# Patient Record
Sex: Male | Born: 1985 | Race: White | Hispanic: Yes | Marital: Married | Smoking: Never smoker
Health system: Southern US, Community
[De-identification: ages and names within clinical notes are randomized; demographics above are authoritative.]

## PROBLEM LIST (undated history)

## (undated) DIAGNOSIS — N200 Calculus of kidney: Secondary | ICD-10-CM

## (undated) HISTORY — PX: APPENDECTOMY: SHX54

---

## 2013-07-01 ENCOUNTER — Ambulatory Visit: Payer: Self-pay

## 2014-07-21 ENCOUNTER — Emergency Department (HOSPITAL_COMMUNITY)
Admission: EM | Admit: 2014-07-21 | Discharge: 2014-07-21 | Disposition: A | Discharge status: Home / Self Care | Payer: Self-pay | Attending: Emergency Medicine | Admitting: Emergency Medicine

## 2014-07-21 ENCOUNTER — Encounter (HOSPITAL_COMMUNITY): Payer: Self-pay | Admitting: Emergency Medicine

## 2014-07-21 ENCOUNTER — Emergency Department (HOSPITAL_COMMUNITY): Payer: Self-pay

## 2014-07-21 DIAGNOSIS — R42 Dizziness and giddiness: Secondary | ICD-10-CM | POA: Insufficient documentation

## 2014-07-21 DIAGNOSIS — R059 Cough, unspecified: Secondary | ICD-10-CM

## 2014-07-21 DIAGNOSIS — R51 Headache: Secondary | ICD-10-CM | POA: Insufficient documentation

## 2014-07-21 DIAGNOSIS — R05 Cough: Secondary | ICD-10-CM | POA: Insufficient documentation

## 2014-07-21 DIAGNOSIS — R112 Nausea with vomiting, unspecified: Secondary | ICD-10-CM | POA: Insufficient documentation

## 2014-07-21 LAB — CBC WITH DIFFERENTIAL/PLATELET
Basophils Absolute: 0.1 10*3/uL (ref 0.0–0.1)
Basophils Relative: 1 % (ref 0–1)
Eosinophils Absolute: 0.2 10*3/uL (ref 0.0–0.7)
Eosinophils Relative: 3 % (ref 0–5)
HCT: 46.4 % (ref 39.0–52.0)
Hemoglobin: 15.8 g/dL (ref 13.0–17.0)
Lymphocytes Relative: 38 % (ref 12–46)
Lymphs Abs: 3 10*3/uL (ref 0.7–4.0)
MCH: 29 pg (ref 26.0–34.0)
MCHC: 34.1 g/dL (ref 30.0–36.0)
MCV: 85.1 fL (ref 78.0–100.0)
Monocytes Absolute: 0.6 10*3/uL (ref 0.1–1.0)
Monocytes Relative: 7 % (ref 3–12)
Neutro Abs: 4.1 10*3/uL (ref 1.7–7.7)
Neutrophils Relative %: 51 % (ref 43–77)
Platelets: 232 10*3/uL (ref 150–400)
RBC: 5.45 MIL/uL (ref 4.22–5.81)
RDW: 13.1 % (ref 11.5–15.5)
WBC: 7.8 10*3/uL (ref 4.0–10.5)

## 2014-07-21 LAB — BASIC METABOLIC PANEL
Anion gap: 14 (ref 5–15)
BUN: 14 mg/dL (ref 6–23)
CO2: 25 meq/L (ref 19–32)
Calcium: 9.7 mg/dL (ref 8.4–10.5)
Chloride: 99 mEq/L (ref 96–112)
Creatinine, Ser: 0.79 mg/dL (ref 0.50–1.35)
GFR calc Af Amer: >90 mL/min (ref >90)
GFR calc non Af Amer: >90 mL/min (ref >90)
Glucose, Bld: 143 mg/dL — ABNORMAL HIGH (ref 70–99)
Potassium: 4.1 mEq/L (ref 3.7–5.3)
Sodium: 138 mEq/L (ref 137–147)

## 2014-07-21 LAB — I-STAT TROPONIN, ED: Troponin i, poc: 0 ng/mL (ref 0.00–0.08)

## 2014-07-21 MED ORDER — MECLIZINE HCL 25 MG PO TABS
25.0000 mg | ORAL_TABLET | Freq: Once | ORAL | Status: AC
  Administered 2014-07-21: 25 mg via ORAL
  Filled 2014-07-21: qty 1

## 2014-07-21 MED ORDER — ONDANSETRON 4 MG PO TBDP: 4.0000 mg | ORAL_TABLET | Freq: Three times a day (TID) | ORAL | Status: DC | PRN

## 2014-07-21 MED ORDER — SODIUM CHLORIDE 0.9 % IV BOLUS (SEPSIS)
1000.0000 mL | Freq: Once | INTRAVENOUS | Status: AC
  Administered 2014-07-21: 1000 mL via INTRAVENOUS

## 2014-07-21 MED ORDER — MECLIZINE HCL 25 MG PO TABS: 25.0000 mg | ORAL_TABLET | Freq: Three times a day (TID) | ORAL | Status: DC | PRN

## 2014-07-21 NOTE — ED Notes (Addendum)
Pt reports relief of dizziness after meclizine given at [id number]

## 2014-07-21 NOTE — ED Notes (Addendum)
Pt presents to ed with complaint of minor headache with "spinning." states the dizziness gets worse with standing. Family at bedside. Alert and oriented. Pt reports nausea/vomiting with dizziness x1.

## 2014-07-21 NOTE — ED Notes (Signed)
Ambulated to BR without difficulty , no c/o's dizziness.

## 2014-07-21 NOTE — ED Notes (Signed)
Pt leaving for x-ray.  ?

## 2014-07-21 NOTE — Discharge Instructions (Signed)
Take the prescribed medication as directed for recurrent dizziness/nausea. Return to the ED for new or worsening symptoms-- increased dizziness, uncontrollable nausea/vomiting, numbness, weakness, etc.

## 2014-07-21 NOTE — ED Provider Notes (Signed)
CSN: [id number]     Arrival date & time 07/21/14  0755 History   First MD Initiated Contact with Patient 07/21/14 0800     Chief Complaint  Patient presents with  . Dizziness     (Consider location/radiation/quality/duration/timing/severity/associated sxs/prior Treatment) Patient is a 28 y.o. male presenting with dizziness. The history is provided by the patient and medical records.  Dizziness   This is a 28 y.o. M with no significant PMH presenting to the ED with dizziness.  Patient states symptoms began this morning upon waking.  He states he feels that the room is spinning, worse with standing or movement, especially movement of his head.  States improved with lying still with his eyes closed.  He has had some nausea and vomiting once this morning.  States he has a minor headache but states "it is nothing".  He also notes a recent cough for the past 2 weeks, non-productive.  Denies fever, sweats, or chills.  Has had prior episodes of dizziness like this in the past but have never lasted this long.  Patient denies photophobia, phonophobia, aura, visual disturbance, tinnitus, changes in speech, confusion, numbness/paresthesias of extremities, gait disturbance, or ataxia.  Patient is not currently on anti-coagulants.  No chest pain, SOB, abdominal pain.  VS stable on arrival.  History reviewed. No pertinent past medical history. Past Surgical History  Procedure Laterality Date  . Appendectomy     History reviewed. No pertinent family history. History  Substance Use Topics  . Smoking status: Never Smoker   . Smokeless tobacco: Never Used  . Alcohol Use: No    Review of Systems  Neurological: Positive for dizziness.  All other systems reviewed and are negative.     Allergies  Review of patient's allergies indicates not on file.  Home Medications   Prior to Admission medications   Not on File   BP 134/68 mmHg  Pulse 75  Temp(Src) 97.8 F (36.6 C) (Oral)  Resp 24  Ht 5\' 6"   (1.676 m)  SpO2 98%   Physical Exam  Constitutional: He is oriented to person, place, and time. He appears well-developed and well-nourished. No distress.  Lying in bed with eyes closed  HENT:  Head: Normocephalic and atraumatic.  Mouth/Throat: Oropharynx is clear and moist.  Eyes: Conjunctivae and EOM are normal. Pupils are equal, round, and reactive to light.  Neck: Normal range of motion. Neck supple.  Cardiovascular: Normal rate, regular rhythm and normal heart sounds.   Pulmonary/Chest: Effort normal and breath sounds normal. No respiratory distress. He has no wheezes.  Abdominal: Soft. Bowel sounds are normal. There is no tenderness. There is no guarding.  Musculoskeletal: Normal range of motion.  Neurological: He is alert and oriented to person, place, and time. He displays no tremor. He displays no seizure activity.  AAOx3, answering questions and following commands appropriately; equal strength UE and LE bilaterally; CN grossly intact; moves all extremities appropriately without ataxia; no focal neuro deficits or facial asymmetry appreciated + nausea when moved head side to side  Skin: Skin is warm and dry. He is not diaphoretic.  Psychiatric: He has a normal mood and affect.  Nursing note and vitals reviewed.   ED Course  Procedures (including critical care time) Labs Review Labs Reviewed  BASIC METABOLIC PANEL - Abnormal; Notable for the following:    Glucose, Bld 143 (*)    All other components within normal limits  CBC WITH DIFFERENTIAL  I-STAT TROPOININ, ED    Imaging Review Dg  Chest 2 View  07/21/2014   CLINICAL DATA:  Cough.  Near syncope.  EXAM: CHEST  2 VIEW  COMPARISON:  None.  FINDINGS: The heart size and mediastinal contours are within normal limits. Both lungs are clear. The visualized skeletal structures are unremarkable.  IMPRESSION: Normal exam.   Electronically Signed   By: Geanie CooleyJim  Maxwell M.D.   On: 07/21/2014 09:01     EKG Interpretation   Date/Time:   Thursday July 21 2014 08:19:49 EST Ventricular Rate:  75 PR Interval:  161 QRS Duration: 93 QT Interval:  366 QTC Calculation: 409 R Axis:   76 Text Interpretation:  Sinus rhythm LVH by voltage Confirmed by POLLINA   MD, CHRISTOPHER ([id number]) on 07/21/2014 8:21:38 AM      MDM   Final diagnoses:  Cough  Dizziness   28 year old male with dizziness. He reports a mild headache. Dizziness worse with movement, associated with nausea and one episode of nonbloody, nonbilious emesis this morning. Patient does have history of same. Neurologic exam is nonfocal. I personally moved patient's head side to side in room, + nausea with this.  Suspect symptoms vertiginous in nature.  Will obtain EKG, labs, CXR.  IVF bolus and dose of meclizine given.  Will monitor closely.  9:48 AM EKG NSR without ischemic changes.  Trop negative. Lab work reassuring.  CXR clear.  orthostatic VS appropriate.  After fluids and meclizine patient states he is feeling better.  He has had no recurrent nausea.  Will ambulate to ensure steady gait.    Patient ambulated with nursing staff, steady gait without complaint of dizziness.  Neurologic exam remains non-focal. Patient stable for discharge.  Rx meclizine and Zofran for any recurrent symptoms. Encouraged close follow-up with PCP.  Discussed plan with patient, he/she acknowledged understanding and agreed with plan of care.  Return precautions given for new or worsening symptoms.  Garlon HatchetLisa M Keston Seever, PA-C 07/21/14 1123  Gilda Creasehristopher J. Pollina, MD 07/21/14 [phone]

## 2015-06-26 ENCOUNTER — Ambulatory Visit: Payer: Self-pay | Attending: Family Medicine

## 2015-10-30 ENCOUNTER — Ambulatory Visit: Payer: Self-pay | Attending: Internal Medicine | Admitting: Internal Medicine

## 2015-10-30 ENCOUNTER — Encounter: Payer: Self-pay | Admitting: Internal Medicine

## 2015-10-30 VITALS — BP 149/82 | HR 82 | Temp 98.7°F | Resp 16 | Ht 66.0 in | Wt 179.6 lb

## 2015-10-30 DIAGNOSIS — R112 Nausea with vomiting, unspecified: Secondary | ICD-10-CM | POA: Insufficient documentation

## 2015-10-30 DIAGNOSIS — Z131 Encounter for screening for diabetes mellitus: Secondary | ICD-10-CM

## 2015-10-30 DIAGNOSIS — J019 Acute sinusitis, unspecified: Secondary | ICD-10-CM

## 2015-10-30 DIAGNOSIS — Z23 Encounter for immunization: Secondary | ICD-10-CM | POA: Insufficient documentation

## 2015-10-30 DIAGNOSIS — H8110 Benign paroxysmal vertigo, unspecified ear: Secondary | ICD-10-CM | POA: Insufficient documentation

## 2015-10-30 DIAGNOSIS — H9209 Otalgia, unspecified ear: Secondary | ICD-10-CM | POA: Insufficient documentation

## 2015-10-30 DIAGNOSIS — J329 Chronic sinusitis, unspecified: Secondary | ICD-10-CM | POA: Insufficient documentation

## 2015-10-30 DIAGNOSIS — H8112 Benign paroxysmal vertigo, left ear: Secondary | ICD-10-CM

## 2015-10-30 DIAGNOSIS — R42 Dizziness and giddiness: Secondary | ICD-10-CM

## 2015-10-30 DIAGNOSIS — Z0000 Encounter for general adult medical examination without abnormal findings: Secondary | ICD-10-CM

## 2015-10-30 LAB — BASIC METABOLIC PANEL
BUN: 13 mg/dL (ref 7–25)
CO2: 27 mmol/L (ref 20–31)
Calcium: 9.7 mg/dL (ref 8.6–10.3)
Chloride: 100 mmol/L (ref 98–110)
Creat: 0.8 mg/dL (ref 0.60–1.35)
Glucose, Bld: 118 mg/dL — ABNORMAL HIGH (ref 65–99)
Potassium: 3.9 mmol/L (ref 3.5–5.3)
Sodium: 138 mmol/L (ref 135–146)

## 2015-10-30 LAB — POCT GLYCOSYLATED HEMOGLOBIN (HGB A1C): Hemoglobin A1C: 5.8

## 2015-10-30 LAB — TSH: TSH: 0.94 mIU/L (ref 0.40–4.50)

## 2015-10-30 MED ORDER — ONDANSETRON 4 MG PO TBDP: 4.0000 mg | ORAL_TABLET | Freq: Three times a day (TID) | ORAL | Status: DC | PRN

## 2015-10-30 MED ORDER — MECLIZINE HCL 25 MG PO TABS: 25.0000 mg | ORAL_TABLET | Freq: Three times a day (TID) | ORAL | Status: DC | PRN

## 2015-10-30 MED ORDER — MECLIZINE HCL 25 MG PO TABS
25.0000 mg | ORAL_TABLET | Freq: Three times a day (TID) | ORAL | Status: DC | PRN
Start: 2015-10-30 — End: 2017-06-13

## 2015-10-30 MED ORDER — LORATADINE 10 MG PO TABS: 10.0000 mg | ORAL_TABLET | Freq: Every day | ORAL | Status: DC

## 2015-10-30 NOTE — Patient Instructions (Addendum)
- financial services for [facility] discount./questions - pick up rx  Followup with me in 2-3 weeks for resolution.  Vrtigo (Vertigo)  Vrtigo es la sensacin de que se est moviendo estando quieto. Puede ser peligroso si ocurre cuando est trabajado, conduciendo vehculos o realizando actividades difciles.  CAUSAS  El vrtigo se produce cuando hay un conflicto en las seales que se envan al cerebro desde los sistemas visual y sensorial del cuerpo. Hay numerosas causas que Dole Foodoriginan este problema, entre las que se incluyen:   Infecciones, especialmente en el odo interno.  Nelia ShiUna mala reaccin a un medicamento o mal uso de alcohol y frmacos.  Abstinencia de drogas o alcohol.  Cambios rpidos de posicin, como al [employer], Incacostarse o darse vuelta en la cama.  Dolor de Surveyor, mineralscabeza migraoso.  Disminucin del flujo sanguneo hacia el cerebro.  Aumento de la presin en el cerebro por un traumatismo, infeccin, tumor o sangrado en la cabeza. SNTOMAS  Puede sentir como si el mundo da vueltas o va a caer al piso. Como hay problemas en el equilibrio, el vrtigo puede causar nuseas y vmitos. Tiene movimientos oculares involuntarios (nistagmus).  DIAGNSTICO  El vrtigo normalmente se diagnostica con un examen fsico. Si la causa no se conoce, el mdico puede indicar diagnstico por imgenes, como una resonancia magntica (imgenes por Health visitorresonancia magntica).  TRATAMIENTO  La mayor parte de los casos de vrtigo se resuelve sin TEFL teachertratamiento. Segn la causa, el mdico podr recetar ciertos medicamentos. Si se relaciona con la posicin del cuerpo, podr recomendarle movimientos o procedimientos para corregir el problema. En algunos casos raros, si la causa del vrtigo es un problema en el odo interno, necesitar [place] ciruga.  INSTRUCCIONES PARA EL CUIDADO DOMICILIARIO  Siga las indicaciones del mdico.  Evite conducir vehculos.  Evite operar maquinarias pesadas.  Evite realizar tareas que seran peligrosas  para usted u otras personas durante un episodio de vrtigo.  Comunquele al mdico si nota que ciertos medicamentos parecen asociarse con las crisis. Algunos medicamentos que se usan para tratar los episodios, en Guardian Life Insurancealgunas personas los empeoran. SOLICITE ATENCIN MDICA DE INMEDIATO SI:  Los medicamentos no [place]alivian las crisis o hacen que estas empeoren.  Tiene dificultad para hablar, caminar, siente debilidad o tiene problemas para Boeingusar los brazos, las manos o las piernas.  Comienza a sufrir un dolor de cabeza intenso.  Las nuseas y los vmitos no se Samoaalivian o se Press photographeragravan.  Aparecen trastornos visuales.  Un miembro de su familia nota cambios en su conducta.  Hay alguna modificacin en su trastorno que parece Holiday representativehacerlo empeorar en lugar de Scientist, clinical (histocompatibility and immunogenetics)mejorar. ASEGRESE DE QUE:   Comprende estas instrucciones.  Controlar su enfermedad.  Solicitar ayuda de inmediato si no mejora o si empeora.   Esta informacin no tiene Theme park managercomo fin reemplazar el consejo del mdico. Asegrese de hacerle al mdico cualquier pregunta que tenga.   Document Released: 05/15/2005 Document Revised: 10/28/2011 Elsevier Interactive Patient Education 2016 [employer]vier Inc.  --  Margot ChimesVrtigo posicional benigno (Benign Positional Vertigo) El vrtigo es la sensacin de que usted o todo lo que lo rodea se mueven cuando en realidad eso no sucede. El vrtigo posicional benigno es el tipo de vrtigo ms comn. La causa de este trastorno no es grave (es benigna). Algunos movimientos y determinadas posiciones pueden desencadenar el trastorno (es posicional). El vrtigo posicional benigno puede ser peligroso si ocurre mientras est haciendo algo que podra suponer un riesgo para usted y para los dems, por ejemplo, conduciendo un automvil.  CAUSAS En muchos de  los [place], se desconoce la causa de este trastorno. Puede deberse a Hotel manager zona del odo interno que ayuda al cerebro a percibir el movimiento y a Administrator, Civil Service  equilibrio. Esta alteracin puede deberse a una infeccin viral (laberintitis), a una lesin en la cabeza o a los movimientos reiterados. FACTORES DE RIESGO Es ms probable que esta afeccin se manifieste en:  Las mujeres.  Las [employer] de [id number]. SNTOMAS Generalmente, los sntomas de este trastorno se presentan al mover la cabeza o los ojos en diferentes direcciones. Pueden aparecer repentinamente y suelen durar menos de un minuto. Entre los sntomas se pueden incluir los siguientes:  Prdida del equilibrio y cadas.  Sensacin de estar dando vueltas o movindose.  Sensacin de que el entorno est dando vueltas o movindose.  Nuseas y vmitos.  Visin borrosa.  Mareos.  Movimientos oculares involuntarios (nistagmo). Los sntomas pueden ser leves y algo fastidiosos, o pueden ser graves e interferir en la vida cotidiana. Los episodios de vrtigo posicional benigno pueden repetirse (ser recurrentes) a lo largo del [place], y algunos movimientos pueden desencadenarlos. Los sntomas pueden mejorar con Museum/gallery conservator. DIAGNSTICO Generalmente, este trastorno se diagnostica con una historia clnica y un examen fsico de la cabeza, el cuello y los odos. Tal vez lo deriven a Catering manager en problemas de la garganta, la nariz y el odo (otorrinolaringlogo), o a uno que se especializa en trastornos del sistema nervioso (neurlogo). Pueden hacerle otros estudios, entre ellos:  Health visitor.  Tomografa computarizada.  Estudios de los [employer]. El mdico puede pedirle que cambie rpidamente de posicin mientras observa si se presentan sntomas de vrtigo posicional benigno, por ejemplo, nistagmo. Los movimientos oculares se pueden estudiar con una electronistagmografa (ENG), con estimulacin trmica, mediante la maniobra de [place] o con la prueba de rotacin.  Electroencefalograma (EEG). Este estudio registra la actividad elctrica del  cerebro.  Pruebas de audicin. Lissa Morales, para tratar este trastorno, el mdico le har movimientos especficos con la cabeza para que el odo interno se normalice. [employer] casos son graves, tal vez haya que realizar una ciruga, pero esto no es frecuente. En algunos casos, el vrtigo posicional benigno se resuelve por s solo en el trmino de 2 o 4semanas. INSTRUCCIONES PARA EL CUIDADO EN EL HOGAR Seguridad  Muvase lentamente.No haga movimientos bruscos con el cuerpo o con la cabeza.  No conduzca.  No opere maquinaria pesada.  No haga ninguna tarea que podra ser peligrosa para usted o para Economist en caso de que ocurriera un episodio de vrtigo.  Si tiene dificultad para caminar o mantener el equilibrio, use un bastn para Photographer estabilidad. Si se siente mareado o inestable, sintese de inmediato.  Reanude sus actividades normales como se lo haya indicado el mdico. Pregntele al mdico qu actividades son seguras para usted. Instrucciones generales  [employer] de venta libre y los recetados solamente como se lo haya indicado el mdico.  Evite algunas posiciones o determinados movimientos como se lo haya indicado el mdico.  Beba suficiente lquido para Pharmacologist la orina clara o de color amarillo plido.  Concurra a todas las visitas de control como se lo haya indicado el mdico. Esto es importante. SOLICITE ATENCIN MDICA SI:  Lance Muss.  El trastorno [place], o le aparecen sntomas nuevos.  Sus familiares o amigos advierten cambios en su comportamiento.  Las nuseas o los vmitos empeoran.  Tiene sensacin de hormigueo o de adormecimiento. SOLICITE ATENCIN MDICA DE INMEDIATO SI:  Tiene dificultad para hablar o para moverse.  Esta mareado todo [employer].  Se desmaya.  Tiene dolores de cabeza intensos.  Tiene debilidad en los brazos o las piernas.  Tiene cambios en la audicin o la visin.  Siente rigidez en el  cuello.  Tiene sensibilidad a Statistician.   Esta informacin no tiene Theme park manager el consejo del mdico. Asegrese de hacerle al mdico cualquier pregunta que tenga.   Document Released: 11/21/2008 Document Revised: 04/26/2015 Elsevier Interactive Patient Education 2016 ArvinMeritor.   - Sinusitis, adultos (Sinusitis, Adult)  La sinusitis es la irritacin, dolor, e hinchazn (inflamacin) de las cavidades de aire en los huesos de la cara (senos paranasales). La irritacin, Chief Technology Officer, e hinchazn puede hacer que el aire y el moco se queden atrapados en los senos paranasales. Esto hace que los grmenes se multipliquen y causen una infeccin.  CUIDADOS EN EL HOGAR   Beba gran cantidad de lquido para mantener el pis (orina) de tono claro o amarillo plido.  Use un humidificador en su hogar.  Deje correr el agua caliente de la ducha para producir vapor en el bao. Sintese en el bao con la puerta cerrada. Inhale vapor de agua 3 a 4 veces al da.  Ponga un pao caliente y hmedo en el rostro 3 a 4 veces al da, o segn las indicaciones de su mdico.  Use aerosoles de agua salada (aerosoles de solucin salina) para Environmental education officer las secreciones nasales espesas. Esto puede ayudar al drenaje de los senos paranasales.  Slo tome los medicamentos que le indique el mdico. SOLICITE AYUDA DE INMEDIATO SI:   El dolor empeora.  Siente un dolor de cabeza intenso.  Tiene Programme researcher, broadcasting/film/video (nuseas).  Vomita.  Tiene mucho sueo (somnolencia).  El rostro est inflamado (hinchado).  Hay cambios en la visin.  Presenta rigidez en el cuello.  Tiene dificultad para respirar. ASEGRESE DE QUE:   Comprende estas instrucciones.  Controlar su enfermedad.  Solicitar ayuda de inmediato si no mejora o si empeora.   Esta informacin no tiene Theme park manager el consejo del mdico. Asegrese de hacerle al mdico cualquier pregunta que tenga.   Document Released: 04/29/2012 Document  Revised: 12/20/2014 Elsevier Interactive Patient Education Yahoo[employer] Inc.

## 2015-10-30 NOTE — Progress Notes (Signed)
Paul Ward, is a 30 y.o. male  ZOX:[id number]  [id number]  DOB - 06/19/1986  CC:  Chief Complaint  Patient presents with  . Dizziness  . Otalgia       HPI: Paul Ward is a 30 y.o. male here today to establish medical care.  Has been having issues with dizziness on off last 1 month.  Last episode, this past Saturday.  Occurs in am, head feels heavy, with sensation of dizziness and tired.  Also co of left ear fullness/congestion since Wedsneday as well, but no f/c/cough/runny nose.  Denies HA/visual disturbances/chest pain/visual changes, but states on Saturday and nausea/emesis with the dizzy spell.  He took some old zofran he had from 12/15 which helped symptoms.  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  He did see a ?chiropractor for his back, which is now better.   He attest to drinking plenty of fluids and urine is normal.   Used Spanish interpreter via phone.  Not on File No past medical history on file. No current outpatient prescriptions on file prior to visit.   No current facility-administered medications on file prior to visit.   No family history on file. Social History   Social History  . Marital Status: Married    Spouse Name: N/A  . Number of Children: N/A  . Years of Education: N/A   Occupational History  . Not on file.   Social History Main Topics  . Smoking status: Never Smoker   . Smokeless tobacco: Never Used  . Alcohol Use: No  . Drug Use: No  . Sexual Activity: Not on file   Other Topics Concern  . Not on file   Social History Narrative    Review of Systems: Constitutional: Negative for fever, chills, diaphoresis, activity change, appetite change and fatigue. HENT: Negative for ear pain, nosebleeds, congestion, facial swelling, rhinorrhea, neck pain, neck stiffness and ear discharge.   + left ear fullness/congestion Eyes: Negative for pain,  discharge, redness, itching and visual disturbance. Respiratory: Negative for cough, choking, chest tightness, shortness of breath, wheezing and stridor.  Cardiovascular: Negative for chest pain, palpitations and leg swelling. Gastrointestinal: Negative for abdominal distention. Genitourinary: Negative for dysuria, urgency, frequency, hematuria, flank pain, decreased urine volume, difficulty urinating and dyspareunia.  Musculoskeletal: Negative for back pain, joint swelling, arthralgia and gait problem. Neurological: Negative for  tremors, seizures, syncope, facial asymmetry, speech difficulty, weakness, light-headedness, numbness and headaches.   + dizziness; co of tinnitus Wednesday as well. Hematological: Negative for adenopathy. Does not bruise/bleed easily. Psychiatric/Behavioral: Negative for hallucinations, behavioral problems, confusion, dysphoric mood, decreased concentration and agitation.    Objective:   Filed Vitals:   10/30/15 1455 10/30/15 1458  BP: 152/78 149/82  Pulse: 75 82  Temp:    Resp:     Negative orthostatics// see RN vs sheet. Reviewed  Physical Exam: Constitutional: Patient appears well-developed and well-nourished. No distress. aaox3 HENT: Normocephalic, atraumatic, External right and left ear normal. Oropharynx is clear and moist.   Some scaring in bilateral tymphanic membraines, but o/w clear.  ttp left max sinus. Eyes: Conjunctivae and EOM are normal. PERRL, no scleral icterus. Neck: Normal ROM. Neck supple. No JVD. No tracheal deviation. No thyromegaly. CVS: RRR, S1/S2 +, no murmurs, no gallops, no carotid bruit.  Pulmonary: Effort and breath sounds normal, no stridor, rhonchi, wheezes, rales.  Abdominal: Soft. BS +, no distension, tenderness, rebound or  guarding.  Musculoskeletal: Normal range of motion. No edema and no tenderness.  Lymphadenopathy: No lymphadenopathy noted, cervical, inguinal or axillary Neuro: Alert. Normal reflexes, muscle tone  coordination. No cranial nerve deficit. Skin: Skin is warm and dry. No rash noted. Not diaphoretic. No erythema. No pallor. Psychiatric: Normal mood and affect. Behavior, judgment, thought content normal.  Lab Results  Component Value Date   WBC 7.8 07/21/2014   HGB 15.8 07/21/2014   HCT 46.4 07/21/2014   MCV 85.1 07/21/2014   PLT 232 07/21/2014   Lab Results  Component Value Date   CREATININE 0.79 07/21/2014   BUN 14 07/21/2014   NA 138 07/21/2014   K 4.1 07/21/2014   CL 99 07/21/2014   CO2 25 07/21/2014    Lab Results  Component Value Date   HGBA1C 5.8 10/30/2015   Lipid Panel  No results found for: CHOL, TRIG, HDL, CHOLHDL, VLDL, LDLCALC     Assessment and plan:   1. Dizziness, suspect due to bpv/inner ear problem, exacerbated by sinusitis. - trial meclizine.  2. Healthcare maintenance - Flu Vaccine QUAD 36+ mos PF IM (Fluarix & Fluzone Quad PF)  3. Screening for diabetes mellitus  - POCT A1C 5.8  4. BPV (benign positional vertigo), left  - Basic Metabolic Panel, r/o dehydration - CBC with Differential, r/o anemia - TSH - ondansetron (ZOFRAN ODT) 4 MG disintegrating tablet; Take 1 tablet (4 mg total) by mouth every 8 (eight) hours as needed for nausea.  Dispense: 10 tablet; Refill: 0 - meclizine (ANTIVERT) 25 MG tablet; Take 1 tablet (25 mg total) by mouth 3 (three) times daily as needed.  Dispense: 20 tablet; Refill: 0  5. Subacute sinusitis, unspecified location Trial, suspect viral - loratadine (CLARITIN) 10 MG tablet; Take 1 tablet (10 mg total) by mouth daily.  Dispense: 30 tablet; Refill: 11   Return in about 2 weeks (around 11/13/2015).  If no better, consider head ct?  The patient was given clear instructions to go to ER or return to medical center if symptoms don't improve, worsen or new problems develop. The patient verbalized understanding. The patient was told to call to get lab results if they haven't heard anything in the next week.     Pete Glatter, MD, MBA/MHA [facility] [place], [place] [phone]   10/30/2015, 3:22 PM

## 2015-10-30 NOTE — Progress Notes (Signed)
Patient c/o feeling dizzy spells for the last 2wks with feeling of nausea.  Patient was seen in the ED for the same sxs and was Dx with low BP.  Patient reports this Saturday, he was experiencing same sxs, but couldn't get out of the bed. He began to break out into a sweat.  Patient also c/o of clogged feeling in L ear x5 days ago.    Patient agreed to flu shot and diabetes screening.

## 2015-11-01 ENCOUNTER — Telehealth: Payer: Self-pay

## 2015-11-01 LAB — CBC WITH DIFFERENTIAL/PLATELET

## 2015-11-01 NOTE — Telephone Encounter (Signed)
[facility] labs called in reference to the patient assession number [id number] Unable to run CBC due to the lavender tube not being labeled

## 2015-11-07 ENCOUNTER — Telehealth: Payer: Self-pay

## 2015-11-07 NOTE — Telephone Encounter (Signed)
CMA called [employer] and spoke with Tobi Bastosnna [phone]#[id number]. Interpreter verified name and DOB. Patient was informed that the cbc drawn was decline by the lab and asked for a redraw. Patient verbalized he understood with no further questions. Patient and Interpreter was transferred to the front for lab appt only.

## 2015-11-07 NOTE — Telephone Encounter (Signed)
-----   Message from Pete Glatterawn T Langeland, MD sent at 11/06/2015  8:47 AM EDT ----- Regarding: RE: CBC collection Please tell him to arrange time to come back for redraw when he can.  Please put in order for future draw cbc around time he plans to come back. thanks ----- Message -----    From: Earnestine LeysKimberly L Bennett-Curse, CMA    Sent: 11/02/2015  12:49 PM      To: Pete Glatterawn T Langeland, MD Subject: CBC collection                                 Hey,  [facility] this patient tube wasn't labeled with the patient name, so they can't run the test. Do you want this patient to come back for a lab visit, or how do you want me to handle this situation?  Thanks!

## 2015-11-08 ENCOUNTER — Ambulatory Visit: Payer: Self-pay | Attending: Internal Medicine

## 2015-11-08 DIAGNOSIS — Z0000 Encounter for general adult medical examination without abnormal findings: Secondary | ICD-10-CM | POA: Insufficient documentation

## 2015-11-08 LAB — CBC WITH DIFFERENTIAL/PLATELET
Basophils Absolute: 0.1 10*3/uL (ref 0.0–0.1)
Basophils Relative: 1 % (ref 0–1)
Eosinophils Absolute: 0.2 10*3/uL (ref 0.0–0.7)
Eosinophils Relative: 3 % (ref 0–5)
HCT: 46 % (ref 39.0–52.0)
Hemoglobin: 15.6 g/dL (ref 13.0–17.0)
Lymphocytes Relative: 48 % — ABNORMAL HIGH (ref 12–46)
Lymphs Abs: 3.2 10*3/uL (ref 0.7–4.0)
MCH: 29.1 pg (ref 26.0–34.0)
MCHC: 33.9 g/dL (ref 30.0–36.0)
MCV: 85.7 fL (ref 78.0–100.0)
MPV: 10.3 fL (ref 8.6–12.4)
Monocytes Absolute: 0.5 10*3/uL (ref 0.1–1.0)
Monocytes Relative: 7 % (ref 3–12)
Neutro Abs: 2.7 10*3/uL (ref 1.7–7.7)
Neutrophils Relative %: 41 % — ABNORMAL LOW (ref 43–77)
Platelets: 293 10*3/uL (ref 150–400)
RBC: 5.37 MIL/uL (ref 4.22–5.81)
RDW: 14.1 % (ref 11.5–15.5)
WBC: 6.6 10*3/uL (ref 4.0–10.5)

## 2015-11-08 NOTE — Progress Notes (Signed)
Patient's here for lab visit only. 

## 2015-11-13 ENCOUNTER — Ambulatory Visit: Payer: Self-pay | Attending: Internal Medicine | Admitting: Internal Medicine

## 2015-11-13 ENCOUNTER — Encounter: Payer: Self-pay | Admitting: Internal Medicine

## 2015-11-13 ENCOUNTER — Telehealth: Payer: Self-pay | Admitting: *Deleted

## 2015-11-13 VITALS — BP 135/92 | HR 65 | Temp 98.1°F | Resp 15 | Ht 66.0 in | Wt 175.2 lb

## 2015-11-13 DIAGNOSIS — J302 Other seasonal allergic rhinitis: Secondary | ICD-10-CM | POA: Insufficient documentation

## 2015-11-13 DIAGNOSIS — R799 Abnormal finding of blood chemistry, unspecified: Secondary | ICD-10-CM

## 2015-11-13 DIAGNOSIS — H8110 Benign paroxysmal vertigo, unspecified ear: Secondary | ICD-10-CM | POA: Insufficient documentation

## 2015-11-13 DIAGNOSIS — L80 Vitiligo: Secondary | ICD-10-CM | POA: Insufficient documentation

## 2015-11-13 DIAGNOSIS — Z79899 Other long term (current) drug therapy: Secondary | ICD-10-CM | POA: Insufficient documentation

## 2015-11-13 NOTE — Progress Notes (Signed)
Patient reports vertigo has disappeared  He is still taking the loratadine prn

## 2015-11-13 NOTE — Telephone Encounter (Signed)
-----   Message from Pete Glatterawn T Langeland, MD sent at 11/09/2015  5:49 PM EDT ----- Please call patient and tell him his blood count labs were all normal, no signs of inflammation or anemia. Thank you.

## 2015-11-13 NOTE — Telephone Encounter (Signed)
Patient in the office today for follow up and labs discussed.

## 2015-11-13 NOTE — Progress Notes (Signed)
   Paul Ward, is a 30 y.o. male  ZOX:[id number]  [id number]:[id number]  DOB - Jul 26, 1986  Chief Complaint  Patient presents with  . Follow-up        Subjective:   Paul Ward is a 30 y.o. male here today for a follow up visit bpv/allergies.  Sxs all resolved. Doing well. No c/o.  Took meclizine for about 2 days, started having heavy head sensation, stopped, and all sxs went away soon after. Only taking claritin as needed, but not congested today.  Asked about his skin, some areas of depigmentation.  Ate breakfast.   Patient has No headache, No chest pain, No abdominal pain - No Nausea, No new weakness tingling or numbness, No Cough - SOB.  Spanish interpreter via video present.  No problems updated.  ALLERGIES: Not on File  PAST MEDICAL HISTORY: History reviewed. No pertinent past medical history.  MEDICATIONS AT HOME: Prior to Admission medications   Medication Sig Start Date End Date Taking? Authorizing Provider  loratadine (CLARITIN) 10 MG tablet Take 1 tablet (10 mg total) by mouth daily. 10/30/15  Yes Pete Glatterawn T Langeland, MD  meclizine (ANTIVERT) 25 MG tablet Take 1 tablet (25 mg total) by mouth 3 (three) times daily as needed. Patient not taking: Reported on 11/13/2015 10/30/15   Pete Glatterawn T Langeland, MD  ondansetron (ZOFRAN ODT) 4 MG disintegrating tablet Take 1 tablet (4 mg total) by mouth every 8 (eight) hours as needed for nausea. Patient not taking: Reported on 11/13/2015 10/30/15   Pete Glatterawn T Langeland, MD     Objective:   Filed Vitals:   11/13/15 0910  BP: 135/92  Pulse: 65  Temp: 98.1 F (36.7 C)  Resp: 15  Height: 5\' 6"  (1.676 m)  Weight: 175 lb 3.2 oz (79.47 kg)  SpO2: 97%    Exam General appearance : Awake, alert, not in any distress. Speech Clear. Not toxic looking. Vitiligo, around chin. HEENT: Atraumatic and Normocephalic, pupils equally reactive to light. Neck: supple, no JVD. No cervical lymphadenopathy.  Chest:Good air entry  bilaterally, no added sounds. CVS: S1 S2 regular, no murmurs/gallups or rubs. Abdomen: Bowel sounds active, Non tender and not distended with no gaurding, rigidity or rebound. Extremities: B/L Lower Ext shows no edema, both legs are warm to touch Neurology: Awake alert, and oriented X 3, CN II-XII grossly intact, Non focal Skin:No Rash  Data Review Lab Results  Component Value Date   HGBA1C 5.8 10/30/2015     Assessment & Plan     1. BPV (benign positional vertigo), unspecified laterality Resolved, likely associated w/ seasonal allergies/sinusitis, prn meclizine/claritin  2. Seasonal allergies Better, prn claritin  3. vitilago - reassurance.  Avoid steroid topicals, use sunscreen.    Patient have been counseled extensively about nutrition and exercise  Return in about 3 months (around 02/13/2016)., fasting next time for lipid panel.  The patient was given clear instructions to go to ER or return to medical center if symptoms don't improve, worsen or new problems develop. The patient verbalized understanding. The patient was told to call to get lab results if they haven't heard anything in the next week.    Pete Glatterawn T Langeland, MD, MBA/MHA [facility] and [facility] [place], [place] [phone]   11/13/2015, 9:15 AM

## 2016-02-27 ENCOUNTER — Encounter: Payer: Self-pay | Admitting: Internal Medicine

## 2016-02-27 ENCOUNTER — Ambulatory Visit: Payer: Self-pay | Attending: Internal Medicine | Admitting: Internal Medicine

## 2016-02-27 VITALS — BP 134/72 | HR 72 | Temp 98.5°F | Resp 16 | Wt 172.2 lb

## 2016-02-27 DIAGNOSIS — Z23 Encounter for immunization: Secondary | ICD-10-CM

## 2016-02-27 DIAGNOSIS — Z79899 Other long term (current) drug therapy: Secondary | ICD-10-CM | POA: Insufficient documentation

## 2016-02-27 DIAGNOSIS — K029 Dental caries, unspecified: Secondary | ICD-10-CM

## 2016-02-27 DIAGNOSIS — K0889 Other specified disorders of teeth and supporting structures: Secondary | ICD-10-CM | POA: Insufficient documentation

## 2016-02-27 DIAGNOSIS — Z114 Encounter for screening for human immunodeficiency virus [HIV]: Secondary | ICD-10-CM

## 2016-02-27 NOTE — Progress Notes (Signed)
Paul Ward, is a 30 y.o. male  ZOX:[id number]  [id number]:[id number]  DOB - [birth year]  Chief Complaint  Patient presents with  . Referral    Dentist        Subjective:   Paul Ward is a 30 y.o. male here today for a follow up visit and Requests for dental referral.  Patient states he is doing well. He denies any further complaints of vertigo. He is not taking meclizine, or Claritin night now. Of note, he had his back molars removed about 10 years ago, but he is having some other Teeth pains recently.  Denies any fevers or chills  Amendable to recd tdap shot and hiv screening.  Patient has No headache, No chest pain, No abdominal pain - No Nausea, No new weakness tingling or numbness, No Cough - SOB.  No problems updated.  ALLERGIES: Not on File  PAST MEDICAL HISTORY: No past medical history on file.  MEDICATIONS AT HOME: Prior to Admission medications   Medication Sig Start Date End Date Taking? Authorizing Provider  loratadine (CLARITIN) 10 MG tablet Take 1 tablet (10 mg total) by mouth daily. Patient not taking: Reported on 02/27/2016 10/30/15   Pete Glatter, MD  meclizine (ANTIVERT) 25 MG tablet Take 1 tablet (25 mg total) by mouth 3 (three) times daily as needed. Patient not taking: Reported on 11/13/2015 10/30/15   Pete Glatter, MD  ondansetron (ZOFRAN ODT) 4 MG disintegrating tablet Take 1 tablet (4 mg total) by mouth every 8 (eight) hours as needed for nausea. Patient not taking: Reported on 11/13/2015 10/30/15   Pete Glatter, MD     Objective:   Filed Vitals:   02/27/16 1505  BP: 134/72  Pulse: 72  Temp: 98.5 F (36.9 C)  TempSrc: Oral  Resp: 16  Weight: 172 lb 3.2 oz (78.109 kg)  SpO2: 99%    Exam General appearance : Awake, alert, not in any distress. Speech Clear. Not toxic looking, pleasant HEENT: Atraumatic and Normocephalic,  bilat tms clear.  Poor dentition, back teeth w/ cavities, but no obvious signs of  gingivitis/gum swelling/infection. Neck: supple, no JVD. No cervical lymphadenopathy.  Chest:Good air entry bilaterally, no added sounds. CVS: S1 S2 regular, Abdomen: Bowel sounds active, soft Extremities: no c/c/e Neurology: Awake alert, and oriented X 3, Non focal Skin:No Rash  Data Review Lab Results  Component Value Date   HGBA1C 5.8 10/30/2015    Depression screen [facility] 2/9 02/27/2016 11/13/2015 10/30/2015  Decreased Interest 0 0 0  Down, Depressed, Hopeless 0 0 0  PHQ - 2 Score 0 0 0  Altered sleeping 0 - -  Change in appetite 0 - -  Feeling bad or failure about yourself  0 - -  Trouble concentrating 0 - -  Moving slowly or fidgety/restless 0 - -  Suicidal thoughts 0 - -  PHQ-9 Score 0 - -  Difficult doing work/chores Not difficult at all - -      Assessment & Plan   1. Dental cavities - Ambulatory referral to Dentistry  2. Screening for HIV (human immunodeficiency virus) - HIV antibody (with reflex)  3. tdap vaccine today. Per recds   Patient have been counseled extensively about nutrition and exercise  Return in about 6 months (around 08/29/2016), or if symptoms worsen or fail to improve.  The patient was given clear instructions to go to ER or return to medical center if symptoms don't improve, worsen or new problems develop. The patient verbalized understanding. The  patient was told to call to get lab results if they haven't heard anything in the next week.   This note has been created with Education officer, environmentalDragon speech recognition software and smart phrase technology. Any transcriptional errors are unintentional.   Pete Glatterawn T Domonick Sittner, MD, MBA/MHA [facility] and [facility] [place], [place] [phone]   02/27/2016, 4:36 PM

## 2016-02-27 NOTE — Patient Instructions (Signed)
Hacer ejercicio para mantenerse sano (Exercising to [employer]) Hacer actividad fsica con regularidad es muy importante. Tiene muchos otros beneficios, como por ejemplo:  Mejorar el estado fsico, la flexibilidad y la resistencia.  Aumenta la densidad sea.  Ayuda a Art gallery manager.  Disminuye la Art gallery manager.  Aumenta la fuerza muscular.  Reduce el estrs y las tensiones.  Mejora el estado de salud general. Para estar sano y [place] as, se recomienda que haga ejercicio de intensidad moderada y de intensidad vigorosa. Puede saber que est haciendo ejercicio de intensidad moderada si tiene una frecuencia cardaca ms elevada y [place] respiracin ms rpida, pero an Therapist, occupational. Puede saber que est haciendo ejercicio de intensidad vigorosa si respira con mucha ms dificultad y rapidez, y no puede Pharmacologist una conversacin. CON QU FRECUENCIA DEBO HACER EJERCICIO? Elija una actividad que disfrute y establezca objetivos realistas. El mdico puede ayudarlo a Event organiser un plan de actividades que funcione para usted. Haga ejercicio regularmente como se lo haya indicado el mdico. Esta puede incluir:   Programme researcher, broadcasting/film/video de resistencia dos veces por semana, como:  Flexiones de [place].  Abdominales.  Levantamiento de pesas.  Ejercicios con bandas elsticas.  Realizar una intensidad determinada de ejercicio durante una cantidad determinada de [place]. Elija entre estas opciones:  de ejercicio de intensidad moderada cada semana.  de ejercicio de intensidad vigorosa cada semana.  Burlene Arnt de ejercicio de intensidad moderada y vigorosa cada semana. Los nios, las mujeres [place], las personas que no estn en forma, las personas con sobrepeso y los adultos mayores tal vez tengan que consultar a un mdico para que les d Medical laboratory scientific officer. Si tiene [employer], asegrese de Science writer al mdico antes de comenzar un  programa de ejercicios nuevo.  CULES SON ALGUNAS IDEAS DE EJERCICIO? Algunas ideas de ejercicio de intensidad moderada incluyen:   Caminar a un ritmo de 1 milla (1,6 kilmetros) en 15 minutos.  Andar en bicicleta.  Hacer senderismo.  Jugar al golf.  Bailar. Algunas ideas de ejercicio de intensidad vigorosa incluyen:   Caminar a un ritmo de al menos 4,5 millas (7 kilmetros) por hora.  Trotar o correr a un ritmo de 5 millas (8 kilmetros) por hora.  Andar en bicicleta a un ritmo de al menos 10 millas (16 kilmetros) por hora.  Practicar natacin.  Practicar patinaje sobre ruedas normales o en lnea.  Hacer esqu de fondo.  Hacer deportes competitivos vigorosos, como ftbol americano, bsquet y ftbol.  Saltar la soga.  Tomar clases de baile aerbico. CULES SON ALGUNAS ACTIVIDADES DIARIAS QUE PUEDEN AYUDARME A HACER EJERCICIO?  Trabajo en el jardn, como:  Empujar una cortadora de csped.  Juntar y embolsar hojas.  Lavar y Arts development officer el automvil.  Empujar un cochecito.  Palear nieve.  Cuidar el jardn.  Lavar las ventanas o los pisos. CMO PUEDO SER MS ACTIVO EN MIS ACTIVIDADES DIARIAS?  Utilice las Microbiologist del ascensor.  D una caminata durante su hora de almuerzo.  Si conduce, estacione el automvil ms lejos del trabajo o de la escuela.  Si [place] transporte pblico, bjese una parada antes y camine el resto del camino.  Pngase de pie y camine cada vez que haga llamadas telefnicas.  Levntese, estrese y camine cada a lo largo del Futures trader. QU PAUTAS DEBO SEGUIR MIENTRAS HAGO EJERCICIO?  No haga ejercicio en exceso que pudiera hacer que se lastime, se sienta mareado o tenga dificultad para respirar.  Consulte al mdico antes  de comenzar un programa de ejercicios nuevo.  Use ropa cmoda y calzado con buen soporte.  Beba gran cantidad de agua mientras hace ejercicios para evitar la deshidratacin o los golpes de Airline pilotcalor. Durante  la actividad fsica se pierde agua corporal que se debe reponer.  Haga ejercicio hasta que se acelere su respiracin y sus latidos cardacos.   Esta informacin no tiene Theme park managercomo fin reemplazar el consejo del mdico. Asegrese de hacerle al mdico cualquier pregunta que tenga.   Document Released: 11/09/2010 Document Revised: 08/26/2014 Elsevier Interactive Patient Education 2016 [employer]   - Caries dentales (Dental Caries) Caries dentales (enfermedad en los dientes) Esta enfermedad puede originar un hueco en los dientes (carie) que puede volverse ms grande y profunda a lo largo del [place]. CUIDADOS EN EL HOGAR  [place] sus dientes y use hilo dental. Hgalo por lo Rite Aidmenos dos veces al da.  Use dentfrico con flor.  Use enjuague bucal si as se lo indica el dentista o el mdico.  Coma menos alimentos con azcar y almidn. Beba menos bebidas azucaradas.  Evite comer con frecuencia bocadillos con azcar y almidn. Evite beber con frecuencia bebidas azucaradas.  Concurra a los controles y limpiezas regulares con Office managerel dentista.  Use suplementos con flor, si as se lo indica el dentista o el mdico.  Permita que le coloquen flor en los dientes si as se lo indica el dentista o el mdico.   Esta informacin no tiene Theme park managercomo fin reemplazar el consejo del mdico. Asegrese de hacerle al mdico cualquier pregunta que tenga.   Document Released: 05/26/2013 Document Revised: 08/26/2014 Elsevier Interactive Patient Education [employer]2016 Elsevier Inc.

## 2016-02-28 ENCOUNTER — Telehealth: Payer: Self-pay

## 2016-02-28 LAB — HIV ANTIBODY (ROUTINE TESTING W REFLEX): HIV 1&2 Ab, 4th Generation: NONREACTIVE

## 2016-02-28 NOTE — Telephone Encounter (Signed)
[employer] Daniel Id#[id number] contacted patient to go over lab results pt is aware of lab results  

## 2017-06-13 ENCOUNTER — Ambulatory Visit (INDEPENDENT_AMBULATORY_CARE_PROVIDER_SITE_OTHER): Payer: Self-pay | Admitting: Physician Assistant

## 2017-06-13 ENCOUNTER — Encounter (INDEPENDENT_AMBULATORY_CARE_PROVIDER_SITE_OTHER): Payer: Self-pay | Admitting: Physician Assistant

## 2017-06-13 VITALS — BP 153/83 | HR 69 | Temp 97.9°F | Ht 65.0 in | Wt 182.0 lb

## 2017-06-13 DIAGNOSIS — S0501XA Injury of conjunctiva and corneal abrasion without foreign body, right eye, initial encounter: Secondary | ICD-10-CM

## 2017-06-13 DIAGNOSIS — R030 Elevated blood-pressure reading, without diagnosis of hypertension: Secondary | ICD-10-CM

## 2017-06-13 MED ORDER — TOBRAMYCIN 0.3 % OP SOLN: 2.0000 [drp] | OPHTHALMIC | 0 refills | Status: AC

## 2017-06-13 NOTE — Progress Notes (Signed)
Subjective:  Patient ID: Paul Ward, male    DOB: 03-Sep-1985  Age: 31 y.o. MRN: [id number]  CC: right eye pain  HPI Paul Ward is a 31 y.o. male with no significant medical history presents with sensation of foreign body sensation in the right eye x2 days. He works as a Designer, fashion/clothing and felt some dust/material fall into his eyes. Has used antibiotic eyedrops for one day which provided relief of discomfort. Denies visual disturbances, limited EOM, or pain.         Outpatient Medications Prior to Visit  Medication Sig Dispense Refill  . loratadine (CLARITIN) 10 MG tablet Take 1 tablet (10 mg total) by mouth daily. (Patient not taking: Reported on 02/27/2016) 30 tablet 11  . meclizine (ANTIVERT) 25 MG tablet Take 1 tablet (25 mg total) by mouth 3 (three) times daily as needed. (Patient not taking: Reported on 11/13/2015) 20 tablet 0  . ondansetron (ZOFRAN ODT) 4 MG disintegrating tablet Take 1 tablet (4 mg total) by mouth every 8 (eight) hours as needed for nausea. (Patient not taking: Reported on 11/13/2015) 10 tablet 0   No facility-administered medications prior to visit.      ROS Review of Systems  Constitutional: Negative for chills, fever and malaise/fatigue.  Eyes: Positive for redness. Negative for blurred vision, double vision, photophobia and discharge.       Right eye discomfort.  Respiratory: Negative for shortness of breath.   Cardiovascular: Negative for chest pain and palpitations.  Gastrointestinal: Negative for abdominal pain and nausea.  Genitourinary: Negative for dysuria and hematuria.  Musculoskeletal: Negative for joint pain and myalgias.  Skin: Negative for rash.  Neurological: Negative for tingling and headaches.  Psychiatric/Behavioral: Negative for depression. The patient is not nervous/anxious.     Objective:  BP (!) 153/83 (BP Location: Left Arm, Patient Position: Sitting, Cuff Size: Normal)   Pulse 69   Temp 97.9 F (36.6 C) (Oral)    Ht 5\' 5"  (1.651 m)   Wt 182 lb (82.6 kg)   SpO2 91%   BMI 30.29 kg/m   BP/Weight 06/13/2017 02/27/2016 11/13/2015  Systolic BP 153 134 135  Diastolic BP 83 72 92  Wt. (Lbs) 182 172.2 175.2  BMI 30.29 27.81 28.29      Physical Exam  Constitutional: He is oriented to person, place, and time.  Well developed, well nourished, NAD, polite  HENT:  Head: Normocephalic and atraumatic.  Eyes: EOM are normal. No scleral icterus.  Right upper palpebra erythema, no edema, no cyst, no foreign body. Sclera not injected, no foreign body seen.  Cardiovascular: Normal rate, regular rhythm and normal heart sounds.   Pulmonary/Chest: Effort normal.  Musculoskeletal: He exhibits no edema.  Neurological: He is alert and oriented to person, place, and time.  Skin: Skin is warm and dry. No rash noted. No erythema. No pallor.  Psychiatric: He has a normal mood and affect. His behavior is normal. Thought content normal.  Vitals reviewed.    Assessment & Plan:   1. Abrasion of right cornea, initial encounter - Advised to see ophthalmologist within 24-48 hours if no better or if he has visual disturbances. - Begin tobramycin (TOBREX) 0.3 % ophthalmic solution; Place 2 drops into both eyes every 4 (four) hours.  Dispense: 5 mL; Refill: 0  2. Elevated blood pressure reading without diagnosis of hypertension.  Meds ordered this encounter  Medications  . tobramycin (TOBREX) 0.3 % ophthalmic solution    Sig: Place 2 drops into both eyes every 4 (  four) hours.    Dispense:  5 mL    Refill:  0    Order Specific Question:   Supervising Provider    Answer:   Quentin AngstJEGEDE, OLUGBEMIGA E [id number]]    Follow-up: Return if symptoms worsen or fail to improve.   Loletta Specteroger David Shafin Pollio PA

## 2017-06-13 NOTE — Patient Instructions (Signed)
Abrasin corneal  (Corneal Abrasion)  La crnea es la cubierta transparente en la parte anterior y central del ojo. Cuando mira la parte colorida del ojo (iris), est mirando a travs de la crnea. La crnea es un tejido muy delgado formado por varias capas. La capa superficial es una capa nica de clulas (epitelio corneal) y es uno de los tejidos ms sensibles del organismo. Si un rasguo o una lesin hacen que el epitelio corneal se desprenda, a esto se lo llama abrasin corneal. Si la lesin se extiende a los tejidos que se encuentran por debajo del epitelio, la afeccin se denomina lcera corneal.  CAUSAS   Rasguos.   Traumatismos.   Cuerpo extrao en el ojo.  Algunas personas tienen recurrencias de abrasiones en la zona de la lesin original incluso despus de que esta ha sanado (sndrome de erosin recurrente). El sndrome de erosin recurrente generalmente mejora y desaparece con el tiempo.  SNTOMAS   Dolor en el ojo.   Dificultad o imposibilidad de mantener abierto el ojo lesionado.   El ojo est muy sensible a la luz.   Las erosiones recurrentes tienden a ocurrir de manera repentina, a primera hora de la maana, generalmente al despertar y abrir los ojos.    DIAGNSTICO  Su mdico podr diagnosticar una abrasin corneal durante un examen ocular. Generalmente se coloca un tinte en el ojo, usando un gotero o una pequea tira de papel humedecida con sus lgrimas. Al examinar el ojo con una luz especial, aparece claramente la abrasin destacada por el tinte.  TRATAMIENTO   Las abrasiones pequeas pueden tratarse con gotas o un ungento con antibitico.   Es posible que le apliquen un parche de presin sobre el ojo. Si este es el caso, siga las instrucciones de su mdico respecto de cundo retirar el parche. No conduzca ni opere maquinaria mientras lleve puesto el parche. Es difcil juzgar las distancias en estas condiciones.  Si la abrasin se infecta y se disemina hacia tejidos ms profundos de la  crnea, puede producirse una lcera corneal. Esto es ms grave porque puede causar una cicatriz en la crnea. Las cicatrices en la crnea interfieren con el paso de la luz a travs de esta y causan prdida de la visin en el ojo involucrado.  INSTRUCCIONES PARA EL CUIDADO EN EL HOGAR   Utilice los medicamentos o el ungento segn lo indicado. Utilice los medicamentos de venta libre o recetados para calmar el dolor, el malestar o la fiebre, segn se lo indique el mdico.   No conduzca ni opere maquinarias mientras tenga el parche en el ojo. En estas condiciones no puede juzgar correctamente las distancias.   Si el mdico le ha dado fecha para una visita de control, es importante que concurra. No cumplir con las visitas de control puede dar como resultado una infeccin grave en el ojo o una prdida permanente de la visin. Si hay algn problema que le impide acudir a la cita, avsele a su mdico.    SOLICITE ATENCIN MDICA SI:   Siente dolor, tiene sensibilidad a la luz y experimenta una sensacin de picazn en un ojo o en ambos.   El parche de presin se afloja continuamente y puede parpadear debajo del parche despus del tratamiento.   Aparece algn tipo de secrecin en el ojo despus del tratamiento o si despierta con los prpados pegados por la maana.   Por la maana, siente los mismos sntomas que sinti en los das en que tuvo la   abrasin original, algunas semanas o meses despus de la curacin.    Esta informacin no tiene como fin reemplazar el consejo del mdico. Asegrese de hacerle al mdico cualquier pregunta que tenga.  Document Released: 08/05/2005 Document Revised: 11/27/2015 Document Reviewed: 04/12/2013  Elsevier Interactive Patient Education  2017 Elsevier Inc.

## 2018-07-20 ENCOUNTER — Encounter (INDEPENDENT_AMBULATORY_CARE_PROVIDER_SITE_OTHER): Payer: Self-pay | Admitting: Physician Assistant

## 2018-07-20 ENCOUNTER — Ambulatory Visit (INDEPENDENT_AMBULATORY_CARE_PROVIDER_SITE_OTHER): Payer: Self-pay | Admitting: Physician Assistant

## 2018-07-20 VITALS — BP 134/84 | HR 71 | Temp 97.8°F | Ht 65.0 in | Wt 187.2 lb

## 2018-07-20 DIAGNOSIS — M25511 Pain in right shoulder: Secondary | ICD-10-CM

## 2018-07-20 DIAGNOSIS — W19XXXA Unspecified fall, initial encounter: Secondary | ICD-10-CM

## 2018-07-20 DIAGNOSIS — Z131 Encounter for screening for diabetes mellitus: Secondary | ICD-10-CM

## 2018-07-20 DIAGNOSIS — Z23 Encounter for immunization: Secondary | ICD-10-CM

## 2018-07-20 DIAGNOSIS — M25561 Pain in right knee: Secondary | ICD-10-CM

## 2018-07-20 DIAGNOSIS — R7303 Prediabetes: Secondary | ICD-10-CM

## 2018-07-20 DIAGNOSIS — H538 Other visual disturbances: Secondary | ICD-10-CM

## 2018-07-20 DIAGNOSIS — G44319 Acute post-traumatic headache, not intractable: Secondary | ICD-10-CM

## 2018-07-20 LAB — POCT GLYCOSYLATED HEMOGLOBIN (HGB A1C): Hemoglobin A1C: 6 % — AB (ref 4.0–5.6)

## 2018-07-20 MED ORDER — NAPROXEN 500 MG PO TABS: 500.0000 mg | ORAL_TABLET | Freq: Two times a day (BID) | ORAL | 1 refills | Status: AC

## 2018-07-20 NOTE — Progress Notes (Signed)
Subjective:  Patient ID: Paul BrunnerCesar Lincks, male    DOB: Apr 30, 1986  Age: 32 y.o. MRN: [id number]  CC: knee pain, head pain from fall  HPI Paul BrunnerCesar Bullinger is a 32 y.o. male with no significant medical history presents with right knee since one month ago after a fall at work. He was standing on a ladder when the ladder slipped under him. The fall was from approximately 10 feet in height. He fell forwards injuring his right knee, right shoulder, and right side of head at the frontal aspect. He says there is a feeling of numbness on the right frontal aspect of the head. Also has right sided visual blurring after he lifts his head up from looking down (head flexed). Says "head feels heavy".  Does not endorse any other neurological deficit.      Right knee has been edematous but has mostly subsided overall. However, there is still mild swelling and the current swelling is more pronounced at night after a day of work. No current pain of the right knee and no limitation of aROM endorsed.      Right shoulder pain is also resolving but there is now a popping sound and mild pain at night. Feels more pain with internal rotation of the right shoulder.      ROS Review of Systems  Constitutional: Negative for chills, fever and malaise/fatigue.  Eyes: Negative for blurred vision.  Respiratory: Negative for shortness of breath.   Cardiovascular: Negative for chest pain and palpitations.  Gastrointestinal: Negative for abdominal pain and nausea.  Genitourinary: Negative for dysuria and hematuria.  Musculoskeletal: Positive for joint pain. Negative for myalgias.  Skin: Negative for rash.  Neurological: Positive for headaches. Negative for tingling.  Psychiatric/Behavioral: Negative for depression. The patient is not nervous/anxious.     Objective:  BP 134/84 (BP Location: Left Arm, Patient Position: Sitting, Cuff Size: Normal)   Pulse 71   Temp 97.8 F (36.6 C) (Oral)   Ht 5\' 5"  (1.651 m)    Wt 187 lb 3.2 oz (84.9 kg)   SpO2 90%   BMI 31.15 kg/m   BP/Weight 07/20/2018 06/13/2017 02/27/2016  Systolic BP 134 153 134  Diastolic BP 84 83 72  Wt. (Lbs) 187.2 182 172.2  BMI 31.15 30.29 27.81      Physical Exam  Constitutional: He is oriented to person, place, and time.  Well developed, well nourished, NAD, polite  HENT:  Head: Normocephalic and atraumatic.  Right side of head without sign of edema, ecchymosis. Mild TTP at the temporal region of right side of head.   Eyes: No scleral icterus.  Neck: Normal range of motion. Neck supple. No thyromegaly present.  Cardiovascular: Normal rate, regular rhythm and normal heart sounds.  Pulmonary/Chest: Effort normal and breath sounds normal.  Musculoskeletal: He exhibits no edema.  Mild right knee edema and mild effusion. Right shoulder with mild pain elicited posteriorly on cross body adduction and with internal rotation stress testing. Negative apprehension, AC shear, Napolean's, and Drop arm testing.   Neurological: He is alert and oriented to person, place, and time.  Skin: Skin is warm and dry. No rash noted. No erythema. No pallor.  Psychiatric: He has a normal mood and affect. His behavior is normal. Thought content normal.  Vitals reviewed.    Assessment & Plan:    1. Acute pain of right knee - DG Knee Complete 4 Views Right; Future - naproxen (NAPROSYN) 500 MG tablet; Take 1 tablet (500 mg total) by mouth  2 (two) times daily with a meal.  Dispense: 30 tablet; Refill: 1  2. Acute post-traumatic headache, not intractable - CT Head Wo Contrast; Future - naproxen (NAPROSYN) 500 MG tablet; Take 1 tablet (500 mg total) by mouth 2 (two) times daily with a meal.  Dispense: 30 tablet; Refill: 1  3. Blurring of visual image of right eye - CT Head Wo Contrast; Future  4. Acute pain of right shoulder - DG Shoulder Right; Future - naproxen (NAPROSYN) 500 MG tablet; Take 1 tablet (500 mg total) by mouth 2 (two) times daily  with a meal.  Dispense: 30 tablet; Refill: 1  5. Screening for diabetes mellitus - HgB A1c 6.0%  6. Prediabetes - Pt chooses to diet and exercise before commencing pharmacotherapy  7. Need for prophylactic vaccination and inoculation against influenza - Flu Vaccine QUAD 6+ mos PF IM (Fluarix Quad PF)   Meds ordered this encounter  Medications  . naproxen (NAPROSYN) 500 MG tablet    Sig: Take 1 tablet (500 mg total) by mouth 2 (two) times daily with a meal.    Dispense:  30 tablet    Refill:  1    Order Specific Question:   Supervising Provider    Answer:   Hoy Register [[id number]]    Follow-up: Return in about 4 weeks (around 08/17/2018) for headache.   Loletta Specter PA

## 2018-07-20 NOTE — Patient Instructions (Signed)
Dolor de cabeza general sin causa (General Headache Without Cause) El dolor de cabeza es un dolor o malestar que se siente en la zona de la cabeza o del cuello. Hay muchas causas y tipos de dolores de cabeza. En algunos casos, es posible que no se encuentre la causa. CUIDADOS EN EL HOGAR Control del dolor  Tome los medicamentos de venta libre y los recetados solamente como se lo haya indicado el mdico.  Cuando sienta dolor de cabeza acustese en un cuarto oscuro y tranquilo.  Si se lo indican, aplique hielo sobre la cabeza y la zona del cuello: ? Ponga el hielo en una bolsa plstica. ? Coloque una toalla entre la piel y la bolsa de hielo. ? Coloque el hielo durante 20minutos, 2 a 3veces por da.  Utilice una almohadilla trmica o tome una ducha con agua caliente para aplicar calor en la cabeza y la zona del cuello como se lo haya indicado el mdico.  Mantenga las luces tenues si le molesta las luces brillantes o sus dolores de cabeza empeoran. Comida y bebida  Mantenga un horario para las comidas.  Beba menos alcohol.  Consuma menos o deje de tomar cafena. Instrucciones generales  Concurra a todas las visitas de control como se lo haya indicado el mdico. Esto es importante.  Lleve un registro diario para averiguar si ciertas cosas provocan los dolores de cabeza. Por ejemplo, escriba los siguientes datos: ? Lo que usted come y bebe. ? Cunto tiempo duerme. ? Algn cambio en su dieta o en los medicamentos.  Realice actividades relajantes, como recibir masajes.  Disminuya el nivel de estrs.  Sintese con la espalda recta. No contraiga (tensione) los msculos.  No consuma productos que contengan tabaco. Estos incluyen cigarrillos, tabaco para mascar y cigarrillos electrnicos. Si necesita ayuda para dejar de fumar, consulte al mdico.  Haga ejercicios con regularidad tal como se lo indic el mdico.  Duerma lo suficiente. Esto a menudo significa entre 7 y 9horas de  sueo. SOLICITE AYUDA SI:  Los medicamentos no logran aliviar los sntomas.  Tiene un dolor de cabeza que es diferente a los otros dolores de cabeza.  Tiene malestar estomacal (nuseas) o vomita.  Tiene fiebre. SOLICITE AYUDA DE INMEDIATO SI:  El dolor de cabeza empeora.  Sigue vomitando.  Presenta rigidez en el cuello.  Tiene dificultad para ver.  Tiene dificultad para hablar.  Siente dolor en el ojo o en el odo.  Sus msculos estn dbiles, o pierde el control muscular.  Pierde el equilibrio o tiene problemas para caminar.  Siente que se desvanece (pierde el conocimiento) o se desmaya.  Se siente confundido. Esta informacin no tiene como fin reemplazar el consejo del mdico. Asegrese de hacerle al mdico cualquier pregunta que tenga. Document Released: 10/28/2011 Document Revised: 04/26/2015 Document Reviewed: 11/28/2014 Elsevier Interactive Patient Education  2018 [employer]  

## 2018-07-23 ENCOUNTER — Ambulatory Visit (HOSPITAL_COMMUNITY)
Admission: RE | Admit: 2018-07-23 | Discharge: 2018-07-23 | Disposition: A | Discharge status: Home / Self Care | Payer: Self-pay | Source: Ambulatory Visit | Attending: Physician Assistant | Admitting: Physician Assistant

## 2018-07-23 DIAGNOSIS — H538 Other visual disturbances: Secondary | ICD-10-CM | POA: Insufficient documentation

## 2018-07-23 DIAGNOSIS — G44319 Acute post-traumatic headache, not intractable: Secondary | ICD-10-CM | POA: Insufficient documentation

## 2018-07-23 DIAGNOSIS — M25561 Pain in right knee: Secondary | ICD-10-CM | POA: Insufficient documentation

## 2018-07-23 DIAGNOSIS — M25511 Pain in right shoulder: Secondary | ICD-10-CM | POA: Insufficient documentation

## 2018-07-24 ENCOUNTER — Telehealth (INDEPENDENT_AMBULATORY_CARE_PROVIDER_SITE_OTHER): Payer: Self-pay

## 2018-07-24 NOTE — Telephone Encounter (Signed)
-----   Message from Loletta Specteroger David Gomez, PA-C sent at 07/24/2018  8:43 AM EST ----- Right knee and right shoulder xrays are normal.

## 2018-07-24 NOTE — Telephone Encounter (Signed)
Call placed using pacific interpreter [phone]([id number]) patient is aware that CT head and Xray of shoulder and knee was normal. Maryjean Mornempestt S Kaye Mitro, CMA

## 2018-07-29 ENCOUNTER — Ambulatory Visit: Payer: Self-pay | Attending: Family Medicine

## 2018-08-17 ENCOUNTER — Ambulatory Visit (INDEPENDENT_AMBULATORY_CARE_PROVIDER_SITE_OTHER): Payer: Self-pay | Admitting: Physician Assistant

## 2018-08-21 ENCOUNTER — Emergency Department (HOSPITAL_COMMUNITY)
Admission: EM | Admit: 2018-08-21 | Discharge: 2018-08-21 | Disposition: A | Discharge status: Home / Self Care | Payer: Self-pay | Attending: Emergency Medicine | Admitting: Emergency Medicine

## 2018-08-21 ENCOUNTER — Other Ambulatory Visit: Payer: Self-pay

## 2018-08-21 ENCOUNTER — Encounter (HOSPITAL_COMMUNITY): Payer: Self-pay | Admitting: Emergency Medicine

## 2018-08-21 ENCOUNTER — Emergency Department (HOSPITAL_COMMUNITY): Payer: Self-pay

## 2018-08-21 DIAGNOSIS — N23 Unspecified renal colic: Secondary | ICD-10-CM | POA: Insufficient documentation

## 2018-08-21 HISTORY — DX: Calculus of kidney: N20.0

## 2018-08-21 LAB — URINALYSIS, ROUTINE W REFLEX MICROSCOPIC
Bacteria, UA: NONE SEEN
Bilirubin Urine: NEGATIVE
Glucose, UA: NEGATIVE mg/dL
Ketones, ur: NEGATIVE mg/dL
Leukocytes, UA: NEGATIVE
Nitrite: NEGATIVE
Protein, ur: 30 mg/dL — AB
RBC / HPF: >50 RBC/hpf — ABNORMAL HIGH (ref 0–5)
Specific Gravity, Urine: 1.02 (ref 1.005–1.030)
pH: 6 (ref 5.0–8.0)

## 2018-08-21 LAB — BASIC METABOLIC PANEL
Anion gap: 11 (ref 5–15)
BUN: 15 mg/dL (ref 6–20)
CO2: 26 mmol/L (ref 22–32)
Calcium: 9.4 mg/dL (ref 8.9–10.3)
Chloride: 103 mmol/L (ref 98–111)
Creatinine, Ser: 1 mg/dL (ref 0.61–1.24)
GFR calc Af Amer: >60 mL/min (ref >60)
GFR calc non Af Amer: >60 mL/min (ref >60)
Glucose, Bld: 140 mg/dL — ABNORMAL HIGH (ref 70–99)
Potassium: 3.5 mmol/L (ref 3.5–5.1)
Sodium: 140 mmol/L (ref 135–145)

## 2018-08-21 LAB — CBC
HCT: 47.1 % (ref 39.0–52.0)
Hemoglobin: 15.5 g/dL (ref 13.0–17.0)
MCH: 28.2 pg (ref 26.0–34.0)
MCHC: 32.9 g/dL (ref 30.0–36.0)
MCV: 85.6 fL (ref 80.0–100.0)
Platelets: 268 10*3/uL (ref 150–400)
RBC: 5.5 MIL/uL (ref 4.22–5.81)
RDW: 12.9 % (ref 11.5–15.5)
WBC: 10.2 10*3/uL (ref 4.0–10.5)
nRBC: 0 % (ref 0.0–0.2)

## 2018-08-21 MED ORDER — TAMSULOSIN HCL 0.4 MG PO CAPS: 0.4000 mg | ORAL_CAPSULE | Freq: Every day | ORAL | 0 refills | Status: AC

## 2018-08-21 MED ORDER — KETOROLAC TROMETHAMINE 30 MG/ML IJ SOLN
30.0000 mg | Freq: Once | INTRAMUSCULAR | Status: AC
  Administered 2018-08-21: 30 mg via INTRAVENOUS
  Filled 2018-08-21: qty 1

## 2018-08-21 MED ORDER — OXYCODONE-ACETAMINOPHEN 5-325 MG PO TABS: 1.0000 | ORAL_TABLET | ORAL | 0 refills | Status: AC | PRN

## 2018-08-21 NOTE — ED Provider Notes (Signed)
[facility] EMERGENCY DEPARTMENT Provider Note   CSN: [id number] Arrival date & time: 08/21/18  0154     History   Chief Complaint Chief Complaint  Patient presents with  . Possible Kidney Stone    HPI Paul Ward is a 33 y.o. male.  The history is provided by the patient. A language interpreter was used.   Paul Ward is a 33 y.o. male who presents to the Emergency Department complaining of flank pain. He presents to the emergency department complaining of right flank pain that began about one week ago. Pain is waxing and waning, worsened last night. He denies any associated fevers, nausea, vomiting, abdominal pain, dysuria. He had a similar episode about eight years ago and had a kidney stone at that time. It passed without any kind of intervention. He denies any medical problems and takes no medications. He received Toradol earlier in his ED stay and he states his pain is significantly improved.  Past Medical History:  Diagnosis Date  . Kidney stones     There are no active problems to display for this patient.   Past Surgical History:  Procedure Laterality Date  . APPENDECTOMY          Home Medications    Prior to Admission medications   Medication Sig Start Date End Date Taking? Authorizing Provider  oxyCODONE-acetaminophen (PERCOCET/ROXICET) 5-325 MG tablet Take 1 tablet by mouth every 4 (four) hours as needed for severe pain. 08/21/18   Tilden Fossa, MD  tamsulosin (FLOMAX) 0.4 MG CAPS capsule Take 1 capsule (0.4 mg total) by mouth daily. 08/21/18   Tilden Fossa, MD    Family History No family history on file.  Social History Social History   Tobacco Use  . Smoking status: Not on file  . Smokeless tobacco: Never Used  Substance Use Topics  . Alcohol use: Not Currently  . Drug use: Never     Allergies   Patient has no known allergies.   Review of Systems Review of Systems  All other systems reviewed and  are negative.    Physical Exam Updated Vital Signs BP 100/81   Pulse 83   Temp 98.4 F (36.9 C) (Oral)   Resp 16   Ht 5\' 2"  (1.575 m)   Wt 81.6 kg   SpO2 94%   BMI 32.92 kg/m   Physical Exam Vitals signs and nursing note reviewed.  Constitutional:      Appearance: He is well-developed.  HENT:     Head: Normocephalic and atraumatic.  Cardiovascular:     Rate and Rhythm: Normal rate and regular rhythm.     Heart sounds: No murmur.  Pulmonary:     Effort: Pulmonary effort is normal. No respiratory distress.     Breath sounds: Normal breath sounds.  Abdominal:     Palpations: Abdomen is soft.     Tenderness: There is no abdominal tenderness. There is no right CVA tenderness, left CVA tenderness, guarding or rebound.  Musculoskeletal:        General: No swelling or tenderness.  Skin:    General: Skin is warm and dry.     Capillary Refill: Capillary refill takes less than 2 seconds.  Neurological:     Mental Status: He is alert and oriented to person, place, and time.  Psychiatric:        Mood and Affect: Mood normal.        Behavior: Behavior normal.      ED Treatments / Results  Labs (all labs ordered are listed, but only abnormal results are displayed) Labs Reviewed  URINALYSIS, ROUTINE W REFLEX MICROSCOPIC - Abnormal; Notable for the following components:      Result Value   APPearance HAZY (*)    Hgb urine dipstick LARGE (*)    Protein, ur 30 (*)    RBC / HPF >50 (*)    All other components within normal limits  BASIC METABOLIC PANEL - Abnormal; Notable for the following components:   Glucose, Bld 140 (*)    All other components within normal limits  CBC    EKG None  Radiology Ct Renal Stone Study  Result Date: 08/21/2018 CLINICAL DATA:  Acute onset of right lower back pain. Difficulty urinating. EXAM: CT ABDOMEN AND PELVIS WITHOUT CONTRAST TECHNIQUE: Multidetector CT imaging of the abdomen and pelvis was performed following the standard protocol  without IV contrast. COMPARISON:  None. FINDINGS: Lower chest: The visualized lung bases are grossly clear. The visualized portions of the mediastinum are unremarkable. Hepatobiliary: The liver is unremarkable in appearance. The gallbladder is unremarkable in appearance. The common bile duct remains normal in caliber. Pancreas: The pancreas is within normal limits. Spleen: The spleen is unremarkable in appearance. Adrenals/Urinary Tract: The adrenal glands are unremarkable in appearance. There is mild right-sided hydronephrosis, with an obstructing large 8 x 7 mm stone proximally at the right ureteropelvic junction. Mild right-sided perinephric stranding is noted. The left kidney is unremarkable. No nonobstructing renal stones are identified. Stomach/Bowel: The stomach is unremarkable in appearance. The small bowel is within normal limits. The patient is status post appendectomy. The colon is unremarkable in appearance. Vascular/Lymphatic: The abdominal aorta is unremarkable in appearance. The inferior vena cava is grossly unremarkable. No retroperitoneal lymphadenopathy is seen. No pelvic sidewall lymphadenopathy is identified. Reproductive: The bladder is decompressed and not well characterized. The prostate remains normal in size. Other: No additional soft tissue abnormalities are seen. Musculoskeletal: No acute osseous abnormalities are identified. The visualized musculature is unremarkable in appearance. IMPRESSION: Mild right-sided hydronephrosis, with an obstructing large 8 x 7 mm stone proximally at the right ureteropelvic junction. Electronically Signed   By: Roanna Raider M.D.   On: 08/21/2018 03:27    Procedures Procedures (including critical care time)  Medications Ordered in ED Medications  ketorolac (TORADOL) 30 MG/ML injection 30 mg (30 mg Intravenous Given 08/21/18 0306)     Initial Impression / Assessment and Plan / ED Course  I have reviewed the triage vital signs and the nursing  notes.  Pertinent labs & imaging results that were available during my care of the patient were reviewed by me and considered in my medical decision making (see chart for details).     Should here for evaluation of one week of right flank pain. His pain is controlled in the emergency department. No evidence of urinary tract infection, renal function is within normal limits. Imaging demonstrates that 7 x 8 mm ureteral stone. Discussed with patient home care for renal colic. Discussed importance of urology follow-up as well as return precautions.  Final Clinical Impressions(s) / ED Diagnoses   Final diagnoses:  Renal colic on right side    ED Discharge Orders         Ordered    tamsulosin (FLOMAX) 0.4 MG CAPS capsule  Daily     08/21/18 0819    oxyCODONE-acetaminophen (PERCOCET/ROXICET) 5-325 MG tablet  Every 4 hours PRN     08/21/18 5830  Tilden Fossaees, Joscelynn Brutus, MD 08/21/18 [phone]

## 2020-04-28 IMAGING — DX DG KNEE COMPLETE 4+V*R*
4 series · 4 of 4 positions shown · non-contrast
Comparison: None.

CLINICAL DATA: Fall from roof, knee pain and swelling.

EXAM:
RIGHT KNEE - COMPLETE 4+ VIEW

[knee ap]
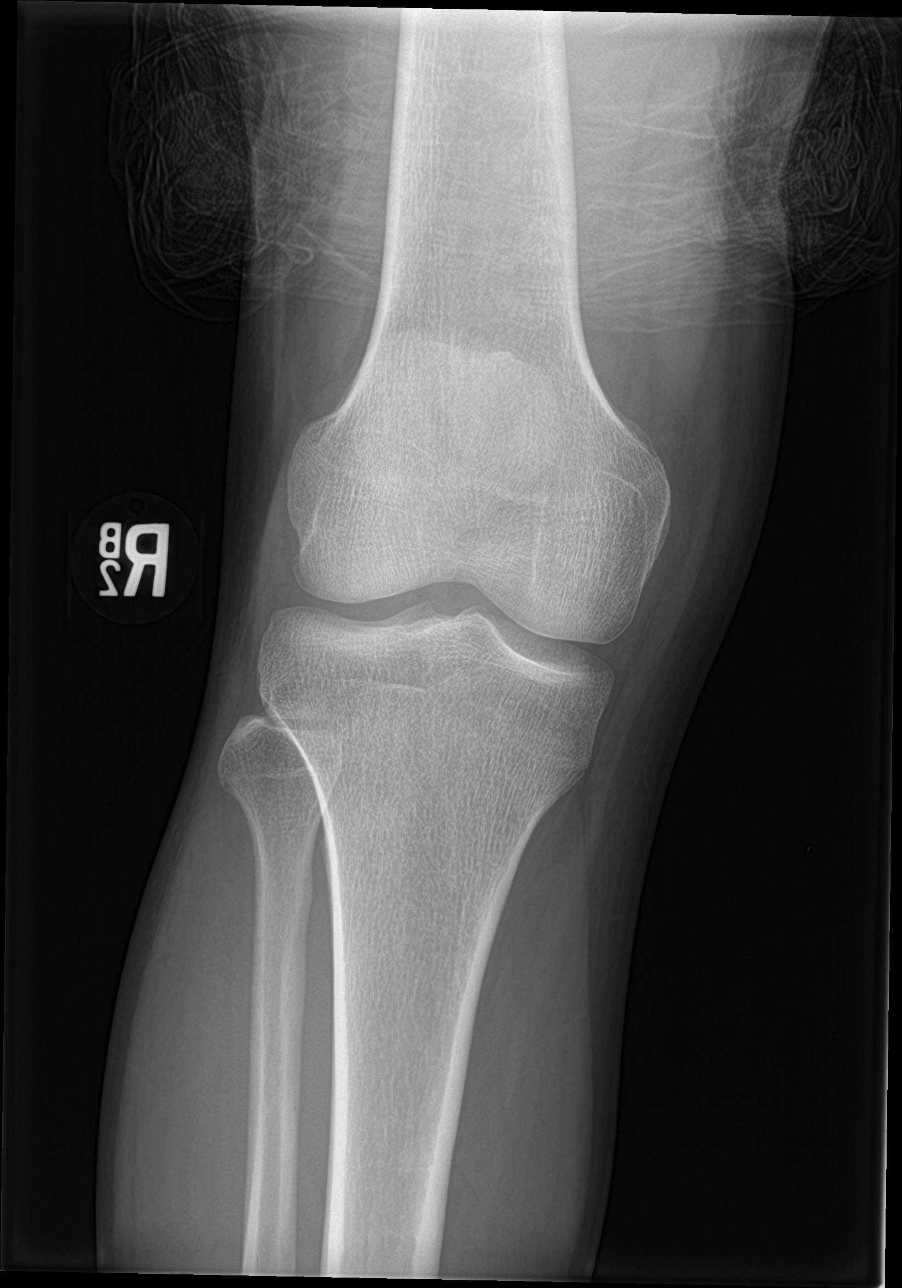

[knee lat]
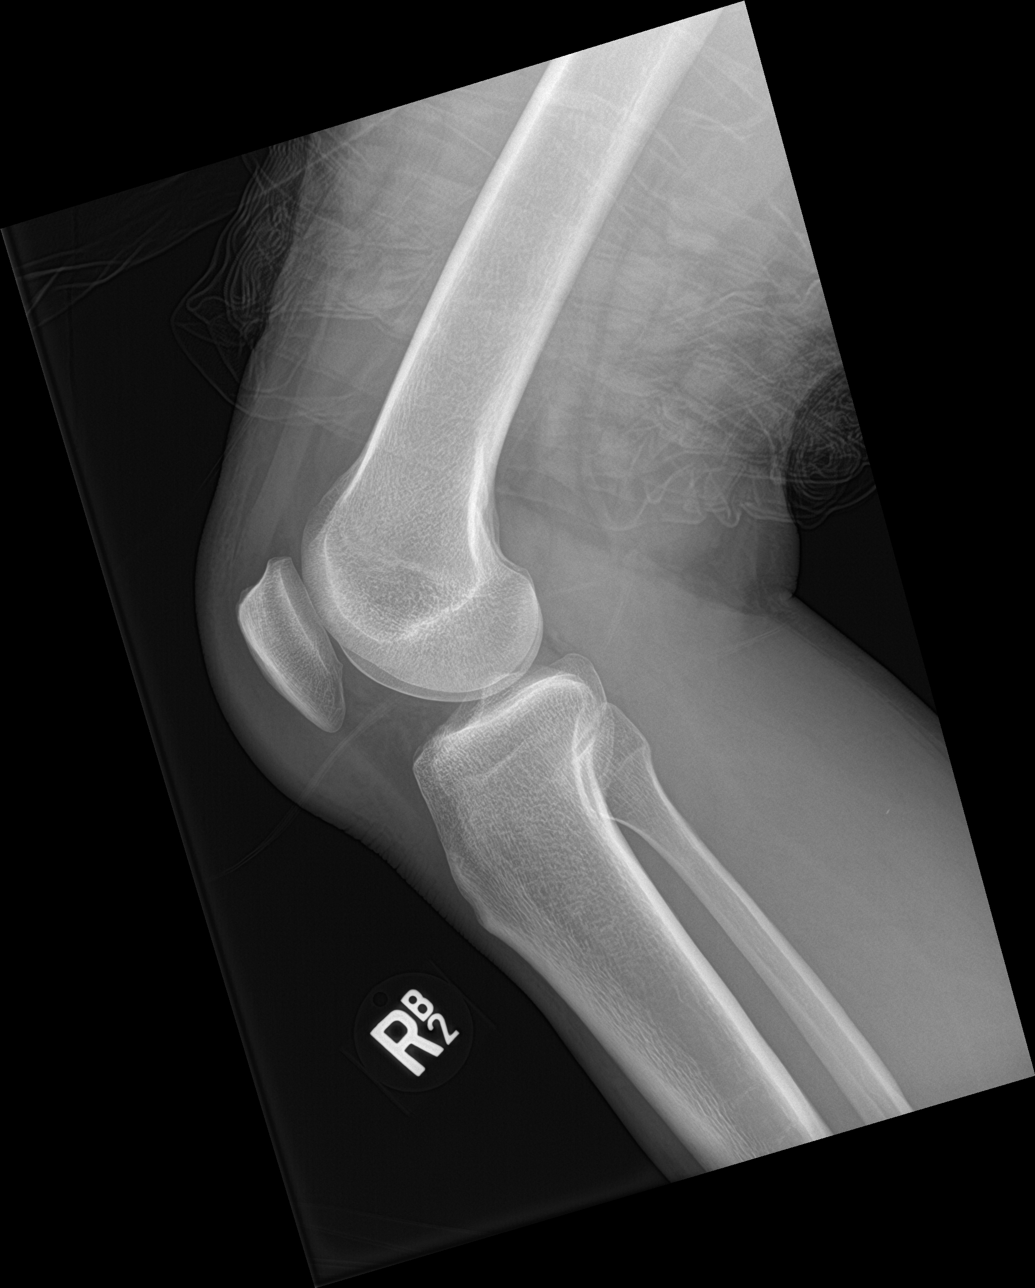

[knee obl (1 of 2)]
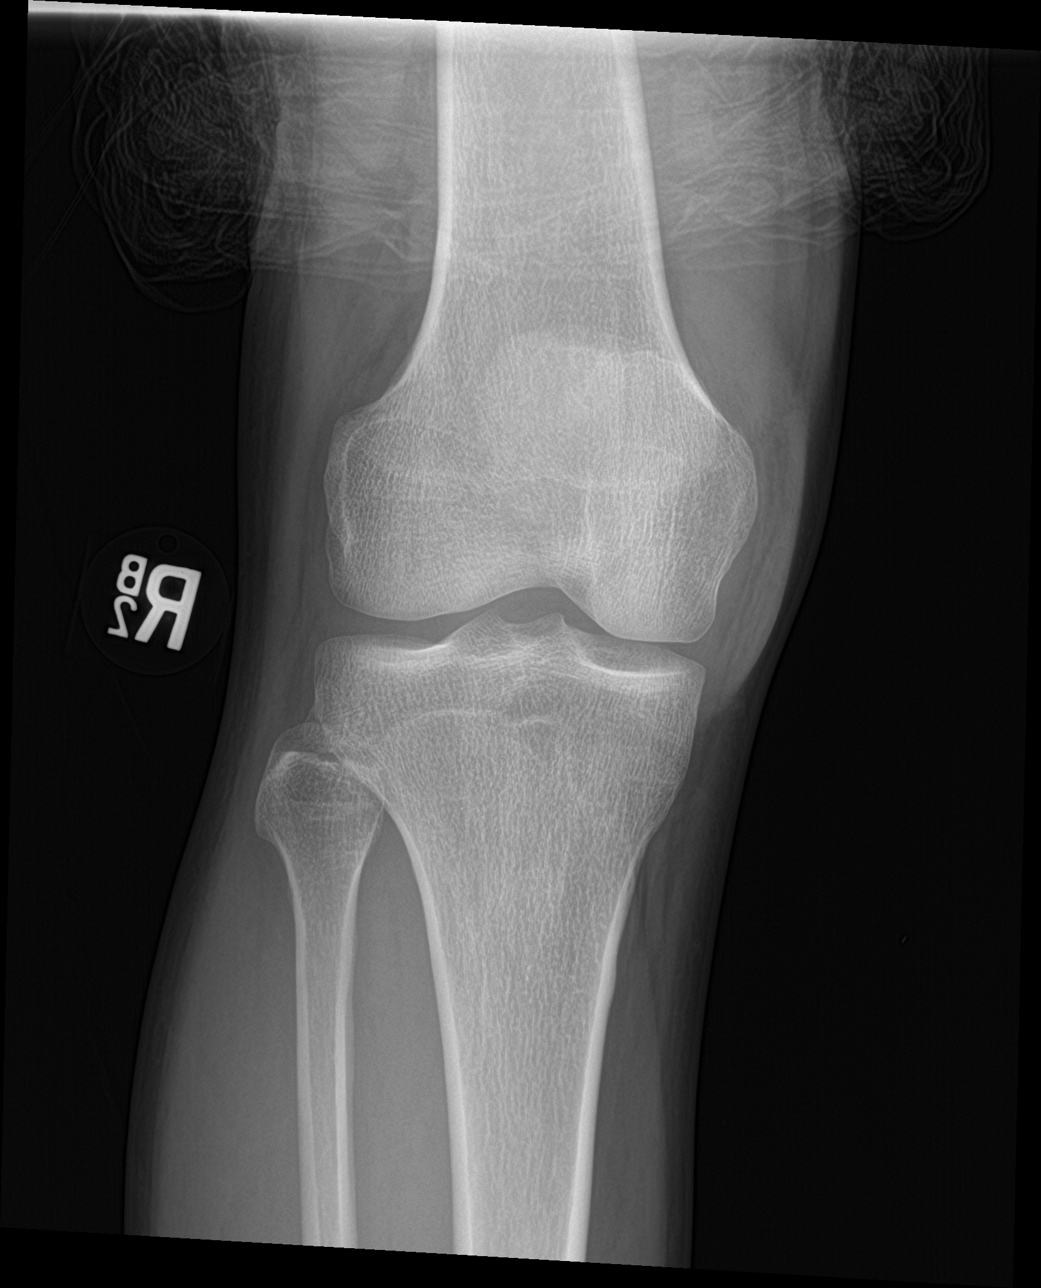

[knee obl (2 of 2)]
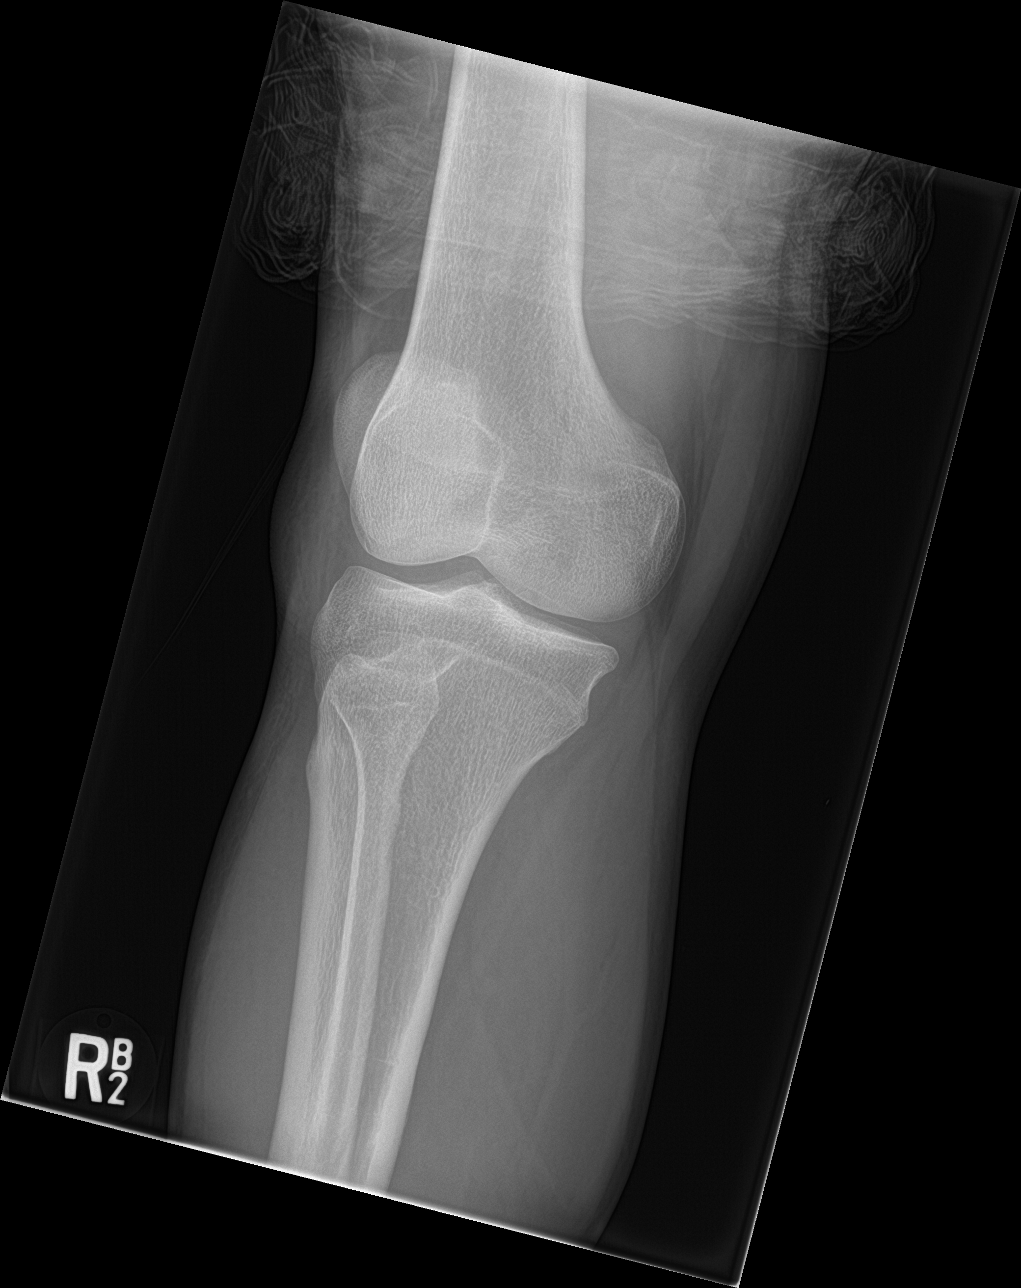

[4 of 4 positions shown; findings below may reference images not displayed]

FINDINGS: Bony alignment is normal. No fracture line or displaced fracture
fragment seen. No appreciable joint effusion and adjacent soft
tissues are unremarkable.
IMPRESSION: Negative.

## 2023-06-02 ENCOUNTER — Emergency Department (HOSPITAL_COMMUNITY)
Admission: EM | Admit: 2023-06-02 | Discharge: 2023-06-02 | Disposition: A | Discharge status: Home / Self Care | Payer: Self-pay | Attending: Emergency Medicine | Admitting: Emergency Medicine

## 2023-06-02 ENCOUNTER — Emergency Department (HOSPITAL_COMMUNITY): Payer: Self-pay

## 2023-06-02 ENCOUNTER — Telehealth: Payer: Self-pay | Admitting: *Deleted

## 2023-06-02 ENCOUNTER — Other Ambulatory Visit: Payer: Self-pay

## 2023-06-02 ENCOUNTER — Encounter (HOSPITAL_COMMUNITY): Payer: Self-pay

## 2023-06-02 DIAGNOSIS — N201 Calculus of ureter: Secondary | ICD-10-CM | POA: Insufficient documentation

## 2023-06-02 DIAGNOSIS — D72829 Elevated white blood cell count, unspecified: Secondary | ICD-10-CM | POA: Insufficient documentation

## 2023-06-02 LAB — CBC
HCT: 47.9 % (ref 39.0–52.0)
Hemoglobin: 15.9 g/dL (ref 13.0–17.0)
MCH: 28.8 pg (ref 26.0–34.0)
MCHC: 33.2 g/dL (ref 30.0–36.0)
MCV: 86.8 fL (ref 80.0–100.0)
Platelets: 286 10*3/uL (ref 150–400)
RBC: 5.52 MIL/uL (ref 4.22–5.81)
RDW: 12.9 % (ref 11.5–15.5)
WBC: 10.8 10*3/uL — ABNORMAL HIGH (ref 4.0–10.5)
nRBC: 0 % (ref 0.0–0.2)

## 2023-06-02 LAB — URINALYSIS, ROUTINE W REFLEX MICROSCOPIC
Bacteria, UA: NONE SEEN
Bilirubin Urine: NEGATIVE
Glucose, UA: NEGATIVE mg/dL
Ketones, ur: NEGATIVE mg/dL
Leukocytes,Ua: NEGATIVE
Nitrite: NEGATIVE
Protein, ur: 30 mg/dL — AB
RBC / HPF: >50 RBC/hpf (ref 0–5)
Specific Gravity, Urine: 1.023 (ref 1.005–1.030)
pH: 5 (ref 5.0–8.0)

## 2023-06-02 LAB — COMPREHENSIVE METABOLIC PANEL
ALT: 52 U/L — ABNORMAL HIGH (ref 0–44)
AST: 31 U/L (ref 15–41)
Albumin: 4.2 g/dL (ref 3.5–5.0)
Alkaline Phosphatase: 77 U/L (ref 38–126)
Anion gap: 14 (ref 5–15)
BUN: 12 mg/dL (ref 6–20)
CO2: 25 mmol/L (ref 22–32)
Calcium: 10 mg/dL (ref 8.9–10.3)
Chloride: 103 mmol/L (ref 98–111)
Creatinine, Ser: 0.98 mg/dL (ref 0.61–1.24)
GFR, Estimated: >60 mL/min (ref >60)
Glucose, Bld: 132 mg/dL — ABNORMAL HIGH (ref 70–99)
Potassium: 3.9 mmol/L (ref 3.5–5.1)
Sodium: 142 mmol/L (ref 135–145)
Total Bilirubin: 0.8 mg/dL (ref 0.3–1.2)
Total Protein: 7.4 g/dL (ref 6.5–8.1)

## 2023-06-02 LAB — LIPASE, BLOOD: Lipase: 41 U/L (ref 11–51)

## 2023-06-02 MED ORDER — TAMSULOSIN HCL 0.4 MG PO CAPS: 0.4000 mg | ORAL_CAPSULE | Freq: Every day | ORAL | 0 refills | Status: AC

## 2023-06-02 MED ORDER — HYDROMORPHONE HCL 1 MG/ML IJ SOLN
1.0000 mg | Freq: Once | INTRAMUSCULAR | Status: AC
  Administered 2023-06-02: 1 mg via INTRAVENOUS
  Filled 2023-06-02 (×2): qty 1

## 2023-06-02 MED ORDER — ONDANSETRON HCL 4 MG/2ML IJ SOLN
4.0000 mg | Freq: Once | INTRAMUSCULAR | Status: AC
  Administered 2023-06-02: 4 mg via INTRAVENOUS
  Filled 2023-06-02: qty 2

## 2023-06-02 MED ORDER — MAGNESIUM SULFATE 2 GM/50ML IV SOLN
2.0000 g | Freq: Once | INTRAVENOUS | Status: AC
  Administered 2023-06-02: 2 g via INTRAVENOUS
  Filled 2023-06-02: qty 50

## 2023-06-02 MED ORDER — LACTATED RINGERS IV BOLUS
1000.0000 mL | Freq: Once | INTRAVENOUS | Status: AC
  Administered 2023-06-02: 1000 mL via INTRAVENOUS

## 2023-06-02 MED ORDER — OXYCODONE HCL 5 MG PO TABS
5.0000 mg | ORAL_TABLET | ORAL | 0 refills | Status: AC | PRN
Start: 2023-06-02 — End: ?

## 2023-06-02 MED ORDER — KETOROLAC TROMETHAMINE 30 MG/ML IJ SOLN
30.0000 mg | Freq: Once | INTRAMUSCULAR | Status: AC
  Administered 2023-06-02: 30 mg via INTRAVENOUS
  Filled 2023-06-02: qty 1

## 2023-06-02 MED ORDER — ONDANSETRON HCL 4 MG PO TABS: 4.0000 mg | ORAL_TABLET | Freq: Four times a day (QID) | ORAL | 0 refills | Status: AC

## 2023-06-02 NOTE — ED Triage Notes (Signed)
Complaining of left sided flank pain that started a couple of days ago in the back. Tonight it is starting to move around to the lower left abdomen. He said that he has a little blood in his urine as well. Has had kidney stones before and it feels like it.

## 2023-06-02 NOTE — Telephone Encounter (Signed)
Pt called regarding pharmacy not receiving Rx as stated on After Visit Summary (AVS).  RNCM asked EDP to resend to pharmacy of choice ([facility] [address]).

## 2023-06-02 NOTE — ED Provider Notes (Signed)
EMERGENCY DEPARTMENT AT [facility] Provider Note   CSN: [id number] Arrival date & time: 06/02/23  0258     History Chief Complaint  Patient presents with   Abdominal Pain    Paul Ward is a 37 y.o. male with history of kidney stones presents emerged from today for evaluation of left flank pain starting a few days prior however it got better but early this morning/late last night the pain started to worsen into his left flank and radiating to his left lower abdomen.  He reports that there has been a small amount of blood in his urine.  Some nausea but no vomiting.  Denies any fever.  Does have small amount of pain whenever he urinates.  He reports this feels very similar to his kidney stones.  No known drug allergies.  Interpreter used during this encounter.   Abdominal Pain Associated symptoms: dysuria, hematuria and nausea   Associated symptoms: no chills, no constipation, no diarrhea, no fever and no vomiting        Home Medications Prior to Admission medications   Medication Sig Start Date End Date Taking? Authorizing Provider  naproxen (NAPROSYN) 500 MG tablet Take 1 tablet (500 mg total) by mouth 2 (two) times daily with a meal. 07/20/18   Loletta Specter, PA-C      Allergies    Patient has no known allergies.    Review of Systems   Review of Systems  Constitutional:  Negative for chills and fever.  Gastrointestinal:  Positive for abdominal pain and nausea. Negative for constipation, diarrhea and vomiting.  Genitourinary:  Positive for dysuria, flank pain and hematuria. Negative for penile discharge, penile pain, penile swelling, scrotal swelling and testicular pain.    Physical Exam Updated Vital Signs BP (!) 140/97   Pulse 67   Temp 97.9 F (36.6 C) (Oral)   Resp 18   Ht 5\' 5"  (1.651 m)   Wt 84.8 kg   SpO2 96%   BMI 31.12 kg/m  Physical Exam Vitals and nursing note reviewed.  Constitutional:      General: He is not  in acute distress.    Appearance: He is not toxic-appearing.  Eyes:     General: No scleral icterus. Cardiovascular:     Rate and Rhythm: Normal rate.  Pulmonary:     Effort: Pulmonary effort is normal. No respiratory distress.  Abdominal:     General: Abdomen is flat. Bowel sounds are normal.     Palpations: Abdomen is soft.     Tenderness: There is no abdominal tenderness. There is left CVA tenderness. There is no right CVA tenderness.  Skin:    General: Skin is warm and dry.  Neurological:     Mental Status: He is alert.     ED Results / Procedures / Treatments   Labs (all labs ordered are listed, but only abnormal results are displayed) Labs Reviewed  COMPREHENSIVE METABOLIC PANEL - Abnormal; Notable for the following components:      Result Value   Glucose, Bld 132 (*)    ALT 52 (*)    All other components within normal limits  CBC - Abnormal; Notable for the following components:   WBC 10.8 (*)    All other components within normal limits  URINALYSIS, ROUTINE W REFLEX MICROSCOPIC - Abnormal; Notable for the following components:   APPearance HAZY (*)    Hgb urine dipstick LARGE (*)    Protein, ur 30 (*)  All other components within normal limits  LIPASE, BLOOD    EKG None  Radiology CT Renal Stone Study  Result Date: 06/02/2023 CLINICAL DATA:  Left-sided flank pain radiating to left lower abdomen. Hematuria. EXAM: CT ABDOMEN AND PELVIS WITHOUT CONTRAST TECHNIQUE: Multidetector CT imaging of the abdomen and pelvis was performed following the standard protocol without IV contrast. RADIATION DOSE REDUCTION: This exam was performed according to the departmental dose-optimization program which includes automated exposure control, adjustment of the mA and/or kV according to patient size and/or use of iterative reconstruction technique. COMPARISON:  None Available. FINDINGS: Lower chest: No acute abnormality. Hepatobiliary: No focal liver abnormality is seen. Fatty  infiltration of the liver is noted. No gallstones, gallbladder wall thickening, or biliary dilatation. Pancreas: Unremarkable. No pancreatic ductal dilatation or surrounding inflammatory changes. Spleen: Normal in size without focal abnormality. Adrenals/Urinary Tract: No adrenal nodule or mass. No renal calculus bilaterally. There is mild fullness of the proximal left collecting system on the left with a 6 x 4 mm calculus in the mid left ureter. No obstructive uropathy on the right. The bladder is unremarkable. Stomach/Bowel: Stomach is within normal limits. Appendix appears normal. No evidence of bowel wall thickening, distention, or inflammatory changes. Free air or pneumatosis. Vascular/Lymphatic: No significant vascular findings are present. No enlarged abdominal or pelvic lymph nodes. Reproductive: Prostate is unremarkable. Other: No abdominopelvic ascites. There is a small fat containing umbilical hernia. Musculoskeletal: No acute osseous abnormality. IMPRESSION: 1. 3 x 6 mm calculus in the mid left ureter with mild fullness of the proximal collecting system on the left. 2. Hepatic steatosis. Electronically Signed   By: Thornell Sartorius M.D.   On: 06/02/2023 04:31    Procedures Procedures   Medications Ordered in ED Medications  ketorolac (TORADOL) 30 MG/ML injection 30 mg (30 mg Intravenous Given 06/02/23 0324)  ondansetron (ZOFRAN) injection 4 mg (4 mg Intravenous Given 06/02/23 0325)  lactated ringers bolus 1,000 mL (0 mLs Intravenous Stopped 06/02/23 0808)  magnesium sulfate IVPB 2 g 50 mL (0 g Intravenous Stopped 06/02/23 0808)  HYDROmorphone (DILAUDID) injection 1 mg (1 mg Intravenous Given 06/02/23 6295)    ED Course/ Medical Decision Making/ A&P                               Medical Decision Making Amount and/or Complexity of Data Reviewed Labs: ordered.  Risk Prescription drug management.   37 y.o. male presents to the ER for evaluation of left-sided flank pain. Differential  diagnosis includes but is not limited to AAA, renal artery/vein embolism/thrombosis, mesenteric ischemia, pyelonephritis, nephrolithiasis, cystitis, biliary colic, pancreatitis, perforated peptic ulcer, appendicitis, diverticulitis, bowel obstruction. Vital signs elevated blood pressure at 149/93, afebrile, otherwise unremarkable. Physical exam as noted above.   Exam and presentation is more consistent with kidney stone.  Will proceed with CT renal study and labs.  Pain medication ordered.  I independently reviewed and interpreted the patient's labs.  CBC shows slight leukocytosis at 10.8.  No anemia.  Lipase within normal limits.  CMP shows glucose at 132 with slight elevated ALT at 52.  Otherwise, no electrolyte or LFT abnormality.  Urinalysis does show hazy urine with large amount of hemoglobin present with greater than 50 red blood cells seen but no white blood cells or bacteria present..  CT renal shows  1. 3 x 6 mm calculus in the mid left ureter with mild fullness of the proximal collecting system on the left.  2. Hepatic steatosis. Per radiologist's read.   Patient reports his pain is under control with the pain medication given here.  He appears in no acute distress.  His blood pressure is significantly improved with pain management improvement.  He does not have a urologist to follow-up with.  I included the information for [facility] into the discharge paperwork.  Overall, the patient has reassuring kidney function and is still producing urine.  I do not see a need to admit him at this time however he does need to follow-up with urology outpatient.  I discussed the lab and imaging findings with patient with provider present.  He reports he is feeling much better and anticipates going home.  We discussed the importance of take the medications as prescribed.  Will prescribe him some tamsulosin, Zofran, and narcotics.  Discussed with him the importance of following up with urology.  We  discussed the results of the labs/imaging. The plan is take medication as prescribed, stay well-hydrated, follow-up with urology. We discussed strict return precautions and red flag symptoms. The patient verbalized their understanding and agrees to the plan. The patient is stable and being discharged home in good condition.  Portions of this report may have been transcribed using voice recognition software. Every effort was made to ensure accuracy; however, inadvertent computerized transcription errors may be present.   Interpreter used during this encounter.  Final Clinical Impression(s) / ED Diagnoses Final diagnoses:  Ureterolithiasis    Rx / DC Orders ED Discharge Orders          Ordered    tamsulosin (FLOMAX) 0.4 MG CAPS capsule  Daily after supper        06/02/23 1212    ondansetron (ZOFRAN) 4 MG tablet  Every 6 hours        06/02/23 1212    oxyCODONE (ROXICODONE) 5 MG immediate release tablet  Every 4 hours PRN        06/02/23 1212              Achille Rich, PA-C 06/04/23 2356    Elayne Snare K, DO 06/07/23 [phone]

## 2023-06-02 NOTE — Discharge Instructions (Signed)
You were seen in the ER for evaluation of your flank pain.  It is over that you do have a kidney stone.  For this, going to send you home on a few medications.  Please take as prescribed.  For pain, I recommended 1000 g of Tylenol and/or 600 mg of ibuprofen every 6 hours as needed for pain.  I have prescribed you a few narcotic pain medications to take for breakthrough pain.  Please do not drive or operate heavy machinery while on these medications as they can make you sleepy.  These medications can also cause constipation so you can try MiraLAX with them as well.  Additionally, would like for you to follow-up with urology to make sure the stone passes.  Information for them is included in the discharge paperwork.  Please make sure you call to schedule an appointment.  If you have any worsening pain, fever, nausea, vomiting, decreased urination or trouble urinating or any significant pain when you pee, please return to your nearest emergency department for reevaluation.  If you have any concerns, new or worsening symptoms, please return to your nearest emergency room and for reevaluation.  Lo atendieron en urgencias para evaluar su dolor en el flanco.  Se acab que tengas un clculo renal.  Para esto, lo enviaremos a casa con algunos medicamentos.  Tmelo segn lo prescrito.  Para el dolor, recomend 1000 g de Tylenol y/o 600 mg de ibuprofeno cada 6 horas segn sea necesario para el dolor.  Le he recetado algunos analgsicos narcticos para que los tome en caso de dolor irruptivo.  No conduzca ni opere maquinaria pesada mientras est tomando estos medicamentos, ya que pueden provocarle sueo.  Estos medicamentos tambin pueden causar estreimiento, por lo que tambin puedes probar MiraLAX con ellos.  Adems, me gustara que hiciera un seguimiento con urologa para asegurarse de que pase el clculo.  La informacin para ellos est incluida en la documentacin de alta.  Asegrese de llamar para programar una cita.   Si tiene [place], [place], nuseas, vmitos, disminucin de la miccin o dificultad para orinar o cualquier dolor significativo al [employer] officer, regrese al departamento de emergencias ms cercano para una reevaluacin.  Si tiene alguna inquietud, sntomas nuevos o que [place], regrese a la sala de emergencias ms cercana para una reevaluacin.  Comunquese con un mdico si: Tiene un dolor que empeora o que no mejora con los medicamentos. Solicite ayuda de inmediato si: Tiene fiebre o escalofros. Siente dolor intenso. Siente dolor abdominal. Se desmaya. No puede orinar.

## 2023-06-03 ENCOUNTER — Encounter (HOSPITAL_COMMUNITY): Payer: Self-pay
